# Patient Record
Sex: Female | Born: 1985 | Race: Black or African American | Hispanic: No | Marital: Single | Smoking: Former smoker
Health system: Southern US, Community
[De-identification: ages and names within clinical notes are randomized; demographics above are authoritative.]

## PROBLEM LIST (undated history)

## (undated) DIAGNOSIS — Z6741 Type O blood, Rh negative: Secondary | ICD-10-CM

## (undated) DIAGNOSIS — O139 Gestational [pregnancy-induced] hypertension without significant proteinuria, unspecified trimester: Secondary | ICD-10-CM

## (undated) DIAGNOSIS — Z8489 Family history of other specified conditions: Secondary | ICD-10-CM

## (undated) DIAGNOSIS — D690 Allergic purpura: Secondary | ICD-10-CM

## (undated) DIAGNOSIS — N390 Urinary tract infection, site not specified: Secondary | ICD-10-CM

## (undated) DIAGNOSIS — I10 Essential (primary) hypertension: Secondary | ICD-10-CM

## (undated) HISTORY — PX: WISDOM TOOTH EXTRACTION: SHX21

## (undated) HISTORY — DX: Essential (primary) hypertension: I10

## (undated) HISTORY — DX: Type O blood, Rh negative: Z67.41

## (undated) HISTORY — DX: Gestational (pregnancy-induced) hypertension without significant proteinuria, unspecified trimester: O13.9

## (undated) HISTORY — PX: OTHER SURGICAL HISTORY: SHX169

---

## 2004-06-18 ENCOUNTER — Emergency Department (HOSPITAL_COMMUNITY): Admission: EM | Admit: 2004-06-18 | Discharge: 2004-06-18 | Payer: Self-pay | Admitting: Emergency Medicine

## 2004-09-24 ENCOUNTER — Emergency Department (HOSPITAL_COMMUNITY): Admission: EM | Admit: 2004-09-24 | Discharge: 2004-09-24 | Payer: Self-pay | Admitting: Emergency Medicine

## 2005-05-05 ENCOUNTER — Emergency Department (HOSPITAL_COMMUNITY): Admission: EM | Admit: 2005-05-05 | Discharge: 2005-05-06 | Payer: Self-pay | Admitting: Emergency Medicine

## 2005-05-26 ENCOUNTER — Emergency Department (HOSPITAL_COMMUNITY): Admission: EM | Admit: 2005-05-26 | Discharge: 2005-05-26 | Payer: Self-pay | Admitting: Family Medicine

## 2005-07-19 ENCOUNTER — Inpatient Hospital Stay (HOSPITAL_COMMUNITY): Admission: AD | Admit: 2005-07-19 | Discharge: 2005-07-20 | Payer: Self-pay | Admitting: *Deleted

## 2005-11-25 ENCOUNTER — Inpatient Hospital Stay (HOSPITAL_COMMUNITY): Admission: AD | Admit: 2005-11-25 | Discharge: 2005-11-25 | Payer: Self-pay | Admitting: Gynecology

## 2005-12-07 ENCOUNTER — Emergency Department (HOSPITAL_COMMUNITY): Admission: EM | Admit: 2005-12-07 | Discharge: 2005-12-07 | Payer: Self-pay | Admitting: Emergency Medicine

## 2006-04-23 ENCOUNTER — Other Ambulatory Visit: Admission: RE | Admit: 2006-04-23 | Discharge: 2006-04-23 | Payer: Self-pay | Admitting: Gynecology

## 2007-09-02 ENCOUNTER — Other Ambulatory Visit: Admission: RE | Admit: 2007-09-02 | Discharge: 2007-09-02 | Payer: Self-pay | Admitting: Gynecology

## 2007-12-08 ENCOUNTER — Emergency Department (HOSPITAL_COMMUNITY): Admission: EM | Admit: 2007-12-08 | Discharge: 2007-12-08 | Payer: Self-pay | Admitting: Emergency Medicine

## 2008-05-03 ENCOUNTER — Encounter: Payer: Self-pay | Admitting: Gynecology

## 2008-05-03 ENCOUNTER — Ambulatory Visit: Payer: Self-pay | Admitting: Gynecology

## 2008-05-03 ENCOUNTER — Other Ambulatory Visit: Admission: RE | Admit: 2008-05-03 | Discharge: 2008-05-03 | Payer: Self-pay | Admitting: Gynecology

## 2008-08-29 DIAGNOSIS — I10 Essential (primary) hypertension: Secondary | ICD-10-CM

## 2008-08-29 HISTORY — DX: Essential (primary) hypertension: I10

## 2008-09-05 ENCOUNTER — Encounter: Payer: Self-pay | Admitting: Women's Health

## 2008-09-05 ENCOUNTER — Other Ambulatory Visit: Admission: RE | Admit: 2008-09-05 | Discharge: 2008-09-05 | Payer: Self-pay | Admitting: Gynecology

## 2008-09-05 ENCOUNTER — Ambulatory Visit: Payer: Self-pay | Admitting: Women's Health

## 2009-01-23 ENCOUNTER — Ambulatory Visit: Payer: Self-pay | Admitting: Women's Health

## 2009-02-23 ENCOUNTER — Ambulatory Visit: Payer: Self-pay | Admitting: Women's Health

## 2009-03-27 ENCOUNTER — Ambulatory Visit: Payer: Self-pay | Admitting: Women's Health

## 2009-08-01 ENCOUNTER — Ambulatory Visit: Payer: Self-pay | Admitting: Women's Health

## 2009-08-17 ENCOUNTER — Ambulatory Visit: Payer: Self-pay | Admitting: Women's Health

## 2009-08-17 ENCOUNTER — Other Ambulatory Visit: Admission: RE | Admit: 2009-08-17 | Discharge: 2009-08-17 | Payer: Self-pay | Admitting: Gynecology

## 2009-11-21 ENCOUNTER — Ambulatory Visit: Payer: Self-pay | Admitting: Gynecology

## 2009-11-30 ENCOUNTER — Inpatient Hospital Stay (HOSPITAL_COMMUNITY): Admission: AD | Admit: 2009-11-30 | Discharge: 2009-11-30 | Payer: Self-pay | Admitting: Gynecology

## 2009-11-30 ENCOUNTER — Ambulatory Visit: Payer: Self-pay | Admitting: Physician Assistant

## 2009-12-18 ENCOUNTER — Ambulatory Visit: Payer: Self-pay | Admitting: Women's Health

## 2010-08-24 ENCOUNTER — Other Ambulatory Visit (HOSPITAL_COMMUNITY)
Admission: RE | Admit: 2010-08-24 | Discharge: 2010-08-24 | Disposition: A | Discharge status: Home / Self Care | Payer: Self-pay | Source: Ambulatory Visit | Attending: Gynecology | Admitting: Gynecology

## 2010-08-24 ENCOUNTER — Other Ambulatory Visit: Payer: Self-pay | Admitting: Women's Health

## 2010-08-24 ENCOUNTER — Ambulatory Visit
Admission: RE | Admit: 2010-08-24 | Discharge: 2010-08-24 | Payer: Self-pay | Source: Home / Self Care | Attending: Women's Health | Admitting: Women's Health

## 2010-08-24 DIAGNOSIS — Z124 Encounter for screening for malignant neoplasm of cervix: Secondary | ICD-10-CM | POA: Insufficient documentation

## 2010-10-16 LAB — ABO/RH
ABO/RH(D): O NEG
Weak D: NEGATIVE

## 2010-10-16 LAB — URINALYSIS, ROUTINE W REFLEX MICROSCOPIC
Bilirubin Urine: NEGATIVE
Glucose, UA: NEGATIVE mg/dL
Ketones, ur: NEGATIVE mg/dL
Nitrite: NEGATIVE
Protein, ur: NEGATIVE mg/dL
Specific Gravity, Urine: 1.015 (ref 1.005–1.030)
Urobilinogen, UA: 0.2 mg/dL (ref 0.0–1.0)
pH: 6.5 (ref 5.0–8.0)

## 2010-10-16 LAB — HCG, QUANTITATIVE, PREGNANCY: hCG, Beta Chain, Quant, S: 454 m[IU]/mL — ABNORMAL HIGH (ref <5)

## 2010-10-16 LAB — CBC
HCT: 39.9 % (ref 36.0–46.0)
Hemoglobin: 13.5 g/dL (ref 12.0–15.0)
MCHC: 33.9 g/dL (ref 30.0–36.0)
MCV: 90.3 fL (ref 78.0–100.0)
Platelets: 296 10*3/uL (ref 150–400)
RBC: 4.41 MIL/uL (ref 3.87–5.11)
RDW: 14.2 % (ref 11.5–15.5)
WBC: 9.7 10*3/uL (ref 4.0–10.5)

## 2010-10-16 LAB — RH IMMUNE GLOBULIN WORKUP (NOT WOMEN'S HOSP)
ABO/RH(D): O NEG
Antibody Screen: NEGATIVE

## 2010-10-16 LAB — URINE MICROSCOPIC-ADD ON

## 2010-10-16 LAB — POCT PREGNANCY, URINE: Preg Test, Ur: POSITIVE

## 2011-01-02 ENCOUNTER — Ambulatory Visit (INDEPENDENT_AMBULATORY_CARE_PROVIDER_SITE_OTHER): Payer: Self-pay | Admitting: Women's Health

## 2011-01-02 DIAGNOSIS — N898 Other specified noninflammatory disorders of vagina: Secondary | ICD-10-CM

## 2011-01-02 DIAGNOSIS — N760 Acute vaginitis: Secondary | ICD-10-CM

## 2011-01-02 DIAGNOSIS — R350 Frequency of micturition: Secondary | ICD-10-CM

## 2011-02-06 ENCOUNTER — Ambulatory Visit: Payer: Self-pay | Admitting: Women's Health

## 2011-02-07 ENCOUNTER — Ambulatory Visit (INDEPENDENT_AMBULATORY_CARE_PROVIDER_SITE_OTHER): Payer: Self-pay | Admitting: Women's Health

## 2011-02-07 DIAGNOSIS — N912 Amenorrhea, unspecified: Secondary | ICD-10-CM

## 2011-02-12 ENCOUNTER — Inpatient Hospital Stay (HOSPITAL_COMMUNITY): Payer: Medicaid Other

## 2011-02-12 ENCOUNTER — Encounter (HOSPITAL_COMMUNITY): Payer: Self-pay | Admitting: *Deleted

## 2011-02-12 ENCOUNTER — Inpatient Hospital Stay (HOSPITAL_COMMUNITY)
Admission: AD | Admit: 2011-02-12 | Discharge: 2011-02-13 | Disposition: A | Discharge status: Home / Self Care | Payer: Medicaid Other | Source: Ambulatory Visit | Attending: Obstetrics & Gynecology | Admitting: Obstetrics & Gynecology

## 2011-02-12 DIAGNOSIS — B9689 Other specified bacterial agents as the cause of diseases classified elsewhere: Secondary | ICD-10-CM

## 2011-02-12 DIAGNOSIS — A499 Bacterial infection, unspecified: Secondary | ICD-10-CM

## 2011-02-12 DIAGNOSIS — D219 Benign neoplasm of connective and other soft tissue, unspecified: Secondary | ICD-10-CM

## 2011-02-12 DIAGNOSIS — O365990 Maternal care for other known or suspected poor fetal growth, unspecified trimester, not applicable or unspecified: Secondary | ICD-10-CM | POA: Insufficient documentation

## 2011-02-12 DIAGNOSIS — O26859 Spotting complicating pregnancy, unspecified trimester: Secondary | ICD-10-CM | POA: Insufficient documentation

## 2011-02-12 DIAGNOSIS — D259 Leiomyoma of uterus, unspecified: Secondary | ICD-10-CM

## 2011-02-12 DIAGNOSIS — N760 Acute vaginitis: Secondary | ICD-10-CM

## 2011-02-12 DIAGNOSIS — O200 Threatened abortion: Secondary | ICD-10-CM

## 2011-02-12 HISTORY — DX: Urinary tract infection, site not specified: N39.0

## 2011-02-12 LAB — URINALYSIS, ROUTINE W REFLEX MICROSCOPIC
Bilirubin Urine: NEGATIVE
Glucose, UA: NEGATIVE mg/dL
Ketones, ur: NEGATIVE mg/dL
Leukocytes, UA: NEGATIVE
Nitrite: NEGATIVE
Protein, ur: NEGATIVE mg/dL
Specific Gravity, Urine: 1.025 (ref 1.005–1.030)
Urobilinogen, UA: 0.2 mg/dL (ref 0.0–1.0)
pH: 6 (ref 5.0–8.0)

## 2011-02-12 LAB — DIFFERENTIAL
Basophils Absolute: 0 10*3/uL (ref 0.0–0.1)
Basophils Relative: 0 % (ref 0–1)
Eosinophils Absolute: 0.2 10*3/uL (ref 0.0–0.7)
Eosinophils Relative: 2 % (ref 0–5)
Lymphocytes Relative: 38 % (ref 12–46)
Lymphs Abs: 4.1 10*3/uL — ABNORMAL HIGH (ref 0.7–4.0)
Monocytes Absolute: 0.9 10*3/uL (ref 0.1–1.0)
Monocytes Relative: 9 % (ref 3–12)
Neutro Abs: 5.5 10*3/uL (ref 1.7–7.7)
Neutrophils Relative %: 51 % (ref 43–77)

## 2011-02-12 LAB — URINE MICROSCOPIC-ADD ON

## 2011-02-12 LAB — CBC
HCT: 38.3 % (ref 36.0–46.0)
Hemoglobin: 12.9 g/dL (ref 12.0–15.0)
MCH: 28.9 pg (ref 26.0–34.0)
MCHC: 33.7 g/dL (ref 30.0–36.0)
MCV: 85.7 fL (ref 78.0–100.0)
Platelets: 284 10*3/uL (ref 150–400)
RBC: 4.47 MIL/uL (ref 3.87–5.11)
RDW: 14.3 % (ref 11.5–15.5)
WBC: 10.8 10*3/uL — ABNORMAL HIGH (ref 4.0–10.5)

## 2011-02-12 LAB — POCT PREGNANCY, URINE: Preg Test, Ur: POSITIVE

## 2011-02-12 LAB — ABO/RH: ABO/RH(D): O NEG

## 2011-02-12 LAB — WET PREP, GENITAL
Trich, Wet Prep: NONE SEEN
Yeast Wet Prep HPF POC: NONE SEEN

## 2011-02-12 LAB — HCG, QUANTITATIVE, PREGNANCY: hCG, Beta Chain, Quant, S: 3311 m[IU]/mL — ABNORMAL HIGH (ref <5)

## 2011-02-12 MED ORDER — METRONIDAZOLE 500 MG PO TABS: 500.0000 mg | ORAL_TABLET | Freq: Two times a day (BID) | ORAL | Status: AC

## 2011-02-12 MED ORDER — RHO D IMMUNE GLOBULIN 1500 UNIT/2ML IJ SOLN: 300.0000 ug | Freq: Once | INTRAMUSCULAR | Status: DC

## 2011-02-12 NOTE — Plan of Care (Signed)
Pt and significant other wanting to leave and come back when Rhophylac is ready.

## 2011-02-12 NOTE — ED Provider Notes (Addendum)
History     Chief Complaint  Patient presents with  . Vaginal Bleeding   The history is provided by the patient.   the patient states that for the past few days she has had spotting. Today began having low abdominal cramping, 6.1 wks. Gest by LMP  Past Medical History  Diagnosis Date  . Hypertension 08/2008    NO MEDS  . Type o blood, rh negative     O-    No past surgical history on file.  Family History  Problem Relation Age of Onset  . Diabetes Father   . Hypertension Father   . Cancer Maternal Grandmother     THROAT CA  . Hypertension Paternal Grandmother     History  Substance Use Topics  . Smoking status: Current Everyday Smoker -- 3 years    Types: Cigarettes  . Smokeless tobacco: Not on file  . Alcohol Use: Yes    OB History    Grav Para Term Preterm Abortions TAB SAB Ect Mult Living   4 0 0 0  3 0 0 0 0      Review of Systems  Gastrointestinal: Positive for nausea and abdominal pain.  Genitourinary: Positive for frequency.  Musculoskeletal: Positive for back pain.  All other systems reviewed and are negative.    Physical Exam  BP 111/57  Pulse 88  Temp(Src) 98.7 F (37.1 C) (Oral)  Resp 16  Ht 5\' 2"  (1.575 m)  Wt 148 lb (67.132 kg)  BMI 27.07 kg/m2  SpO2 99%  LMP 12/31/2010  Physical Exam  Nursing note and vitals reviewed. Constitutional: She is oriented to person, place, and time. She appears well-developed and well-nourished.  Eyes: EOM are normal.  Neck: Neck supple.  Pulmonary/Chest: Effort normal.  Abdominal: Soft. There is tenderness in the right lower quadrant. There is no rebound and no guarding.  Genitourinary: Uterus is not enlarged. Cervix exhibits motion tenderness. Right adnexum displays tenderness.       Small amount of blood vaginal vault.  Musculoskeletal: Normal range of motion.  Neurological: She is alert and oriented to person, place, and time. No cranial nerve deficit.  Skin: Skin is warm and dry.    ED Course    Procedures  MDM Ultrasound shows a 5.1 wk. IUGS with YS and small SCH and small fibroid.  Will d/c pt. Home with inst. On threatened AB.     [employer], [place] 02/28/11 [phone]

## 2011-02-12 NOTE — Progress Notes (Signed)
Pt c/o  Pink spotting once Saturday and once today.  She also says she has had a mild ache yesterday and today in her low abd.

## 2011-02-12 NOTE — Progress Notes (Signed)
Pt G3 P0 at 6.1wks, reports small amt of blood on tissue x 2 episodes.  Pt having lower abd aching that started today,  Hx of Yeast inf, treated with monistat 2 wks ago.

## 2011-02-13 ENCOUNTER — Inpatient Hospital Stay (HOSPITAL_COMMUNITY)
Admission: AD | Admit: 2011-02-13 | Discharge: 2011-02-13 | Disposition: A | Discharge status: Home / Self Care | Payer: Medicaid Other | Source: Ambulatory Visit | Attending: Obstetrics and Gynecology | Admitting: Obstetrics and Gynecology

## 2011-02-13 ENCOUNTER — Other Ambulatory Visit (HOSPITAL_COMMUNITY): Payer: Self-pay | Admitting: *Deleted

## 2011-02-13 DIAGNOSIS — O209 Hemorrhage in early pregnancy, unspecified: Secondary | ICD-10-CM | POA: Insufficient documentation

## 2011-02-13 LAB — GC/CHLAMYDIA PROBE AMP, GENITAL
Chlamydia, DNA Probe: NEGATIVE
GC Probe Amp, Genital: NEGATIVE

## 2011-02-13 MED ORDER — RHO D IMMUNE GLOBULIN 1500 UNIT/2ML IJ SOLN
300.0000 ug | Freq: Once | INTRAMUSCULAR | Status: AC
  Administered 2011-02-13: 300 ug via INTRAMUSCULAR

## 2011-02-13 MED ORDER — RHO D IMMUNE GLOBULIN 1500 UNIT/2ML IJ SOLN: 300.0000 ug | Freq: Once | INTRAMUSCULAR | Status: DC

## 2011-02-13 NOTE — ED Notes (Signed)
The rhophlac order did not cross over in the computer to the Blood Bank/lab-due to this-they would not release the med -T.Stallings,HC was called and the [employer] person was called-lab agreed to take a written order-this all took about 1 hour to problem solve-the pt just left and stated she will come back tomorrow-note she was here 6 hours for this visit-she states she has to work tomorrow and she cannot stay any longer

## 2011-02-14 LAB — RH IG WORKUP (INCLUDES ABO/RH)
ABO/RH(D): O NEG
Antibody Screen: NEGATIVE
Gestational Age(Wks): 6
Unit division: 0

## 2011-02-18 ENCOUNTER — Inpatient Hospital Stay (HOSPITAL_COMMUNITY): Payer: Medicaid Other

## 2011-02-18 ENCOUNTER — Inpatient Hospital Stay (HOSPITAL_COMMUNITY)
Admission: AD | Admit: 2011-02-18 | Discharge: 2011-02-18 | Disposition: A | Discharge status: Home / Self Care | Payer: Medicaid Other | Source: Ambulatory Visit | Attending: Obstetrics & Gynecology | Admitting: Obstetrics & Gynecology

## 2011-02-18 ENCOUNTER — Encounter (HOSPITAL_COMMUNITY): Payer: Self-pay | Admitting: *Deleted

## 2011-02-18 DIAGNOSIS — Z3400 Encounter for supervision of normal first pregnancy, unspecified trimester: Secondary | ICD-10-CM

## 2011-02-18 DIAGNOSIS — Z3480 Encounter for supervision of other normal pregnancy, unspecified trimester: Secondary | ICD-10-CM | POA: Insufficient documentation

## 2011-02-18 DIAGNOSIS — O4690 Antepartum hemorrhage, unspecified, unspecified trimester: Secondary | ICD-10-CM | POA: Insufficient documentation

## 2011-02-18 LAB — URINE MICROSCOPIC-ADD ON

## 2011-02-18 LAB — URINALYSIS, ROUTINE W REFLEX MICROSCOPIC
Bilirubin Urine: NEGATIVE
Glucose, UA: NEGATIVE mg/dL
Ketones, ur: NEGATIVE mg/dL
Leukocytes, UA: NEGATIVE
Nitrite: NEGATIVE
Protein, ur: NEGATIVE mg/dL
Specific Gravity, Urine: 1.02 (ref 1.005–1.030)
Urobilinogen, UA: 0.2 mg/dL (ref 0.0–1.0)
pH: 6.5 (ref 5.0–8.0)

## 2011-02-18 NOTE — Progress Notes (Signed)
Pt states lmp-01/08/2011, awoke early this am, has intercourse, noted small clot when she went to restroom, no bleeding at present. Denies pain.

## 2011-02-18 NOTE — Progress Notes (Signed)
Pt in c/o spotting after intercourse- states she passed a very small clot.  Has had no cramping.  Denies any pain or bleeding present.  Was seen here on 7/17- treated for BV.  Has 2 more pills to take.  Received rhogam on 7/18.

## 2011-02-18 NOTE — ED Provider Notes (Signed)
Suzanne Bullock is a 25 y.o. female presenting for confirmation of viable IUP. She was seen in MAU 6 days ago for spotting and received Rhophylac. She denies any further spotting or abd pain. History OB History    Grav Para Term Preterm Abortions TAB SAB Ect Mult Living   4    3 1 2    0     Past Medical History  Diagnosis Date  . Hypertension 08/2008    NO MEDS  . Type o blood, rh negative     O-  . Urinary tract infection    Past Surgical History  Procedure Date  . Abortions   . No past surgeries    Family History: family history includes Cancer in her maternal grandmother; Diabetes in her father; and Hypertension in her father and paternal grandmother. Social History:  reports that she has quit smoking. Her smoking use included Cigarettes. She has a .75 pack-year smoking history. She does not have any smokeless tobacco history on file. She reports that she does not drink alcohol or use illicit drugs.  Review of Systems  All other systems reviewed and are negative.     Blood pressure 128/55, pulse 94, temperature 98 F (36.7 C), temperature source Oral, resp. rate 18, height 5\' 2"  (1.575 m), weight 66.395 kg (146 lb 6 oz), last menstrual period 12/31/2010. Maternal Exam:  Abdomen: Patient reports no abdominal tenderness.   Physical Exam  Vitals reviewed. Constitutional: She is oriented to person, place, and time. She appears well-developed and well-nourished. No distress.  Cardiovascular: Normal rate.   Respiratory: Effort normal.  GI: Soft.  Neurological: She is alert and oriented to person, place, and time.  Skin: Skin is warm and dry.  Psychiatric: She has a normal mood and affect.    Prenatal labs: ABO, Rh: O NEG (07/18 0914) Antibody: NEG (07/18 0914) Rubella:   RPR:    HBsAg:    HIV:    GBS:     [place]: 5.6 week SIUP, FHR 66, +YS and FP  Assessment/Plan: 1. 5.6 week viable, SIUP  Plan:  1. D/C home 2. SAB precautions 3. Initiate PNC. Plans to go to [facility]  Avid Guillette 02/18/2011, 5:43 PM

## 2011-02-22 ENCOUNTER — Ambulatory Visit (INDEPENDENT_AMBULATORY_CARE_PROVIDER_SITE_OTHER): Payer: Medicaid Other | Admitting: Women's Health

## 2011-02-22 ENCOUNTER — Encounter: Payer: Self-pay | Admitting: Women's Health

## 2011-02-22 VITALS — BP 120/70

## 2011-02-22 DIAGNOSIS — B373 Candidiasis of vulva and vagina: Secondary | ICD-10-CM

## 2011-02-22 DIAGNOSIS — N898 Other specified noninflammatory disorders of vagina: Secondary | ICD-10-CM

## 2011-02-22 DIAGNOSIS — N899 Noninflammatory disorder of vagina, unspecified: Secondary | ICD-10-CM

## 2011-02-22 MED ORDER — TERCONAZOLE 0.8 % VA CREA: 1.0000 | TOPICAL_CREAM | Freq: Every day | VAGINAL | Status: AC

## 2011-02-22 NOTE — Progress Notes (Signed)
  25 yo [redacted]wk pregnant who was treated with flagyl last week for BV. She was treated at [facility]. She has not started her prenatal care yet. She is taking a prenatal vitamin daily. She had tried some over-the-counter Monistat without good relief. Denies any bleeding or pain. She has had negative cultures with her current partner. She is without insurance and requests to have prenatal care done later, she is in the process of being Medicaid approved. Offered and declined viability ultrasound today.  External genitalia is erythematous. Speculum exam copious white curdy discharge was noted. Wet prep is positive for yeast.  Bimanual uterus was 6-8 weeks' size nontender no adnexal fullness or tenderness. We'll treat with Terazol 3 one applicator HS x3 prescriptions was given. Yeast prevention discussed, return to office if no relief and schedule prenatal care ASAP.Marland Kitchen

## 2011-03-17 ENCOUNTER — Encounter (HOSPITAL_COMMUNITY): Payer: Self-pay | Admitting: Advanced Practice Midwife

## 2011-03-17 ENCOUNTER — Inpatient Hospital Stay (HOSPITAL_COMMUNITY): Payer: Medicaid Other

## 2011-03-17 ENCOUNTER — Inpatient Hospital Stay (HOSPITAL_COMMUNITY)
Admission: AD | Admit: 2011-03-17 | Discharge: 2011-03-17 | Disposition: A | Discharge status: Home / Self Care | Payer: Medicaid Other | Source: Ambulatory Visit | Attending: Obstetrics & Gynecology | Admitting: Obstetrics & Gynecology

## 2011-03-17 DIAGNOSIS — O034 Incomplete spontaneous abortion without complication: Secondary | ICD-10-CM | POA: Insufficient documentation

## 2011-03-17 LAB — CBC
HCT: 41 % (ref 36.0–46.0)
Hemoglobin: 13.9 g/dL (ref 12.0–15.0)
MCH: 29 pg (ref 26.0–34.0)
MCHC: 33.9 g/dL (ref 30.0–36.0)
MCV: 85.4 fL (ref 78.0–100.0)
Platelets: 299 10*3/uL (ref 150–400)
RBC: 4.8 MIL/uL (ref 3.87–5.11)
RDW: 14.4 % (ref 11.5–15.5)
WBC: 7.2 10*3/uL (ref 4.0–10.5)

## 2011-03-17 LAB — HCG, QUANTITATIVE, PREGNANCY: hCG, Beta Chain, Quant, S: 4249 m[IU]/mL — ABNORMAL HIGH (ref <5)

## 2011-03-17 NOTE — ED Provider Notes (Signed)
History     Chief Complaint  Patient presents with  . Vaginal Bleeding   HPI Intercourse yesterday, bleeding afterwards, no cramping, bleeding stopped last night. Small clot this morning, started bleeding again this afternoon. No pain. Has had recurrent episodes of bleeding since onset of pregnancy. Blood type is O neg, had rhogam in MAU in July. U/S in MAU on 7/23 showed 5.6 wk IUP, FHR 66.   OB History    Grav Para Term Preterm Abortions TAB SAB Ect Mult Living   4    3 2 1    0      Past Medical History  Diagnosis Date  . Hypertension 08/2008    NO MEDS  . Type o blood, rh negative     O-  . Urinary tract infection     Past Surgical History  Procedure Date  . Abortions   . No past surgeries     Family History  Problem Relation Age of Onset  . Diabetes Father   . Hypertension Father   . Cancer Maternal Grandmother     THROAT CA  . Hypertension Paternal Grandmother     History  Substance Use Topics  . Smoking status: Former Smoker -- 0.2 packs/day for 3 years    Types: Cigarettes  . Smokeless tobacco: Not on file  . Alcohol Use: No    Allergies: No Known Allergies  Prescriptions prior to admission  Medication Sig Dispense Refill  . acetaminophen (TYLENOL) 500 MG tablet Take 1,000 mg by mouth daily as needed. For pain.       . prenatal vitamin w/FE, FA (PRENATAL 1 + 1) 27-1 MG TABS Take 1 tablet by mouth daily.          Review of Systems  Constitutional: Negative.   Respiratory: Negative.   Cardiovascular: Negative.   Gastrointestinal: Negative for nausea, vomiting, abdominal pain, diarrhea and constipation.  Genitourinary: Negative for dysuria, urgency, frequency, hematuria and flank pain.       Negative for pain, Positive for vaginal bleeding  Musculoskeletal: Negative.   Neurological: Negative.   Psychiatric/Behavioral: Negative.    Physical Exam   Blood pressure 130/86, pulse 85, temperature 97.9 F (36.6 C), temperature source Oral, resp. rate  18, height 5\' 3"  (1.6 m), weight 67.042 kg (147 lb 12.8 oz), last menstrual period 01/08/2011.  Physical Exam  Nursing note and vitals reviewed. Constitutional: She is oriented to person, place, and time. She appears well-developed and well-nourished. No distress.  HENT:  Head: Normocephalic and atraumatic.  Cardiovascular: Normal rate.   Respiratory: Effort normal.  GI: Soft. Bowel sounds are normal. She exhibits no mass. There is no tenderness. There is no rebound and no guarding.  Genitourinary: There is no rash or lesion on the right labia. There is no rash or lesion on the left labia. There is bleeding (small to moderate amount) around the vagina. No tenderness around the vagina. No vaginal discharge found.  Musculoskeletal: Normal range of motion.  Neurological: She is alert and oriented to person, place, and time.  Skin: Skin is warm and dry.  Psychiatric: She has a normal mood and affect.    MAU Course  Procedures  U/S shows 5.6 wk IUGS, YS and fetal pole, no FHR. Prior u/s on 7/23 showed 5.6 wk fetal pole with FHR of 66. Incomplete AB.   Assessment and Plan  25 y.o. G4P0030 with incomplete AB Counseled patient regarding expectant mgmt vs. cytotec - pt desires expectant management at this time.  F/U 1 week in MAU for repeat quant Return sooner with excessive bleeding or severe pain  Ociel Retherford 03/17/2011, 1:46 PM

## 2011-03-17 NOTE — Progress Notes (Signed)
Pt states that after intercourse yesterday she had some vaginal bleeding. Pt reports that she had a golf ball sized clot yesterday, then a smaller clot later last night. This morning she reports brown vaginal discharge and  On small clot.

## 2011-03-17 NOTE — ED Provider Notes (Signed)
Attestation of Attending Supervision of Advanced Practitioner: Evaluation and management procedures were performed by the PA/NP/CNM/OB Fellow under my supervision/collaboration. Chart reviewed and agree with management and plan.  Icis Budreau A 03/17/2011 5:56 PM   

## 2011-03-17 NOTE — Initial Assessments (Signed)
Pt refused comfort care packet and pillow. Pt states that she does not want a chaplain to call.

## 2011-03-18 ENCOUNTER — Inpatient Hospital Stay (HOSPITAL_COMMUNITY)
Admission: AD | Admit: 2011-03-18 | Discharge: 2011-03-19 | Disposition: A | Discharge status: Home / Self Care | Payer: Medicaid Other | Source: Ambulatory Visit | Attending: Obstetrics & Gynecology | Admitting: Obstetrics & Gynecology

## 2011-03-18 ENCOUNTER — Encounter (HOSPITAL_COMMUNITY): Payer: Self-pay | Admitting: *Deleted

## 2011-03-18 DIAGNOSIS — O034 Incomplete spontaneous abortion without complication: Secondary | ICD-10-CM | POA: Insufficient documentation

## 2011-03-18 DIAGNOSIS — O039 Complete or unspecified spontaneous abortion without complication: Secondary | ICD-10-CM

## 2011-03-18 MED ORDER — KETOROLAC TROMETHAMINE 60 MG/2ML IM SOLN
60.0000 mg | Freq: Once | INTRAMUSCULAR | Status: AC
  Administered 2011-03-18: 60 mg via INTRAMUSCULAR
  Filled 2011-03-18: qty 2

## 2011-03-18 NOTE — Progress Notes (Signed)
PT ARRIVED  SAYING GOING TO PASS OUT- BREATHING FAST. SAYS SHE WAS HERE LAST NIGHT FOR SAB. AND BLEEDING BECAME WORSE- PASSING CLOTS AT 7PM . HAS CRAMPING.  IN RM 6- WASH CLOTH - HAS SMALL AMT RED BLOOD .

## 2011-03-19 LAB — CBC
HCT: 36.8 % (ref 36.0–46.0)
Hemoglobin: 12.4 g/dL (ref 12.0–15.0)
MCH: 29.4 pg (ref 26.0–34.0)
MCHC: 33.7 g/dL (ref 30.0–36.0)
MCV: 87.2 fL (ref 78.0–100.0)
Platelets: 270 10*3/uL (ref 150–400)
RBC: 4.22 MIL/uL (ref 3.87–5.11)
RDW: 14.3 % (ref 11.5–15.5)
WBC: 12.7 10*3/uL — ABNORMAL HIGH (ref 4.0–10.5)

## 2011-03-19 MED ORDER — IBUPROFEN 600 MG PO TABS: 600.0000 mg | ORAL_TABLET | Freq: Four times a day (QID) | ORAL | Status: AC | PRN

## 2011-03-19 NOTE — Progress Notes (Signed)
Pain reported as improved once tissue removed from cervical os.

## 2011-03-19 NOTE — ED Provider Notes (Signed)
History     Chief Complaint  Patient presents with  . Vaginal Bleeding   HPI  Pt here for increased vaginal bleeding.  Seen in MAU on 03/17/11 and diagnosed with incomplete miscarriage by ultrasound (fetal heart rate not seen after previously being visible).  Blood type RH neg, received Rhogam 4 weeks ago. DC'd home with expectant management yesterday.  Reports increased bleeding that began around 1400 with cramping.  Feels like "going to pass out" due to the pain.  +nausea.  Past Medical History  Diagnosis Date  . Hypertension 08/2008    NO MEDS  . Type o blood, rh negative     O-  . Urinary tract infection     Past Surgical History  Procedure Date  . Abortions   . No past surgeries     Family History  Problem Relation Age of Onset  . Diabetes Father   . Hypertension Father   . Cancer Maternal Grandmother     THROAT CA  . Hypertension Paternal Grandmother     History  Substance Use Topics  . Smoking status: Current Everyday Smoker -- 0.2 packs/day for 3 years    Types: Cigarettes  . Smokeless tobacco: Not on file  . Alcohol Use: No    Allergies: No Known Allergies  Prescriptions prior to admission  Medication Sig Dispense Refill  . acetaminophen (TYLENOL) 500 MG tablet Take 1,000 mg by mouth daily as needed. For pain.       . prenatal vitamin w/FE, FA (PRENATAL 1 + 1) 27-1 MG TABS Take 1 tablet by mouth daily.          Review of Systems  Gastrointestinal: Positive for nausea and abdominal pain. Negative for vomiting.  Genitourinary:       Vaginal bleeding  All other systems reviewed and are negative.   Physical Exam   Blood pressure 120/72, pulse 90, temperature 97.9 F (36.6 C), temperature source Oral, resp. rate 22, height 5\' 3"  (1.6 m), weight 66.679 kg (147 lb), last menstrual period 01/08/2011.  Physical Exam  Constitutional: She is oriented to person, place, and time. She appears well-developed and well-nourished. She appears distressed (appears  uncomfortable).       Uncomfortable appearing  HENT:  Head: Normocephalic.  Neck: Normal range of motion. Neck supple.  Cardiovascular: Normal rate, regular rhythm and normal heart sounds.  Exam reveals no gallop and no friction rub.   No murmur heard. Respiratory: Effort normal and breath sounds normal. No respiratory distress.  GI: Soft. She exhibits no mass. There is tenderness (suprapubic). There is no rebound and no guarding.  Genitourinary: There is bleeding (moderate; +clots; fetal tissue teased out os) around the vagina.  Neurological: She is alert and oriented to person, place, and time.  Skin: Skin is warm and dry.    MAU Course  Procedures  CBC - wnl Toradol  Assessment and Plan  Miscarriage  RX Ibuprofen Explained bleeding with miscarriage and when to return.  [facility] 03/19/2011, 12:08 AM

## 2011-03-19 NOTE — ED Provider Notes (Signed)
Agree with above note.  Georgie Haque H. 03/19/2011 6:27 AM

## 2011-03-19 NOTE — Progress Notes (Signed)
Bleeding scant at time of discharge.

## 2011-03-19 NOTE — Progress Notes (Signed)
Pt reports pain 0/10 and desires discharge home.

## 2011-03-24 ENCOUNTER — Ambulatory Visit (HOSPITAL_COMMUNITY): Payer: Medicaid Other

## 2011-03-30 DEATH — deceased

## 2011-07-26 ENCOUNTER — Emergency Department (HOSPITAL_COMMUNITY): Payer: No Typology Code available for payment source

## 2011-07-26 ENCOUNTER — Emergency Department (HOSPITAL_COMMUNITY)
Admission: EM | Admit: 2011-07-26 | Discharge: 2011-07-26 | Disposition: A | Discharge status: Home / Self Care | Payer: No Typology Code available for payment source | Attending: Emergency Medicine | Admitting: Emergency Medicine

## 2011-07-26 ENCOUNTER — Encounter (HOSPITAL_COMMUNITY): Payer: Self-pay | Admitting: Emergency Medicine

## 2011-07-26 DIAGNOSIS — S139XXA Sprain of joints and ligaments of unspecified parts of neck, initial encounter: Secondary | ICD-10-CM | POA: Insufficient documentation

## 2011-07-26 DIAGNOSIS — M549 Dorsalgia, unspecified: Secondary | ICD-10-CM | POA: Insufficient documentation

## 2011-07-26 DIAGNOSIS — S161XXA Strain of muscle, fascia and tendon at neck level, initial encounter: Secondary | ICD-10-CM

## 2011-07-26 LAB — POCT PREGNANCY, URINE: Preg Test, Ur: NEGATIVE

## 2011-07-26 MED ORDER — HYDROCODONE-ACETAMINOPHEN 5-500 MG PO TABS: 1.0000 | ORAL_TABLET | Freq: Four times a day (QID) | ORAL | Status: AC | PRN

## 2011-07-26 MED ORDER — IBUPROFEN 600 MG PO TABS: 600.0000 mg | ORAL_TABLET | Freq: Four times a day (QID) | ORAL | Status: AC | PRN

## 2011-07-26 MED ORDER — OXYCODONE-ACETAMINOPHEN 5-325 MG PO TABS
2.0000 | ORAL_TABLET | Freq: Once | ORAL | Status: AC
  Administered 2011-07-26: 2 via ORAL
  Filled 2011-07-26: qty 2
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
## 2012-01-17 ENCOUNTER — Inpatient Hospital Stay (HOSPITAL_COMMUNITY)
Admission: AD | Admit: 2012-01-17 | Discharge: 2012-01-17 | Disposition: A | Discharge status: Home / Self Care | Payer: Medicaid Other | Source: Ambulatory Visit | Attending: Obstetrics & Gynecology | Admitting: Obstetrics & Gynecology

## 2012-01-17 ENCOUNTER — Inpatient Hospital Stay (HOSPITAL_COMMUNITY)
Admission: AD | Admit: 2012-01-17 | Discharge: 2012-01-17 | Disposition: A | Discharge status: Home / Self Care | Payer: Medicaid Other | Source: Ambulatory Visit | Attending: Obstetrics and Gynecology | Admitting: Obstetrics and Gynecology

## 2012-01-17 ENCOUNTER — Encounter (HOSPITAL_COMMUNITY): Payer: Self-pay | Admitting: *Deleted

## 2012-01-17 ENCOUNTER — Inpatient Hospital Stay (HOSPITAL_COMMUNITY): Payer: Medicaid Other

## 2012-01-17 ENCOUNTER — Other Ambulatory Visit: Payer: Self-pay | Admitting: Advanced Practice Midwife

## 2012-01-17 DIAGNOSIS — O209 Hemorrhage in early pregnancy, unspecified: Secondary | ICD-10-CM | POA: Insufficient documentation

## 2012-01-17 DIAGNOSIS — Z298 Encounter for other specified prophylactic measures: Secondary | ICD-10-CM | POA: Insufficient documentation

## 2012-01-17 DIAGNOSIS — Z2989 Encounter for other specified prophylactic measures: Secondary | ICD-10-CM | POA: Insufficient documentation

## 2012-01-17 LAB — ABO/RH: ABO/RH(D): O NEG

## 2012-01-17 LAB — URINALYSIS, ROUTINE W REFLEX MICROSCOPIC
Bilirubin Urine: NEGATIVE
Glucose, UA: NEGATIVE mg/dL
Ketones, ur: NEGATIVE mg/dL
Leukocytes, UA: NEGATIVE
Nitrite: NEGATIVE
Protein, ur: NEGATIVE mg/dL
Specific Gravity, Urine: <1.005 — ABNORMAL LOW (ref 1.005–1.030)
Urobilinogen, UA: 0.2 mg/dL (ref 0.0–1.0)
pH: 6 (ref 5.0–8.0)

## 2012-01-17 LAB — URINE MICROSCOPIC-ADD ON

## 2012-01-17 LAB — CBC
HCT: 37.8 % (ref 36.0–46.0)
Hemoglobin: 12.5 g/dL (ref 12.0–15.0)
MCH: 28.4 pg (ref 26.0–34.0)
MCHC: 33.1 g/dL (ref 30.0–36.0)
MCV: 85.9 fL (ref 78.0–100.0)
Platelets: 318 10*3/uL (ref 150–400)
RBC: 4.4 MIL/uL (ref 3.87–5.11)
RDW: 13.9 % (ref 11.5–15.5)
WBC: 8.9 10*3/uL (ref 4.0–10.5)

## 2012-01-17 LAB — WET PREP, GENITAL
Clue Cells Wet Prep HPF POC: NONE SEEN
Trich, Wet Prep: NONE SEEN
Yeast Wet Prep HPF POC: NONE SEEN

## 2012-01-17 LAB — DIFFERENTIAL
Basophils Absolute: 0 10*3/uL (ref 0.0–0.1)
Basophils Relative: 0 % (ref 0–1)
Eosinophils Absolute: 0.1 10*3/uL (ref 0.0–0.7)
Eosinophils Relative: 1 % (ref 0–5)
Lymphocytes Relative: 37 % (ref 12–46)
Lymphs Abs: 3.3 10*3/uL (ref 0.7–4.0)
Monocytes Absolute: 0.5 10*3/uL (ref 0.1–1.0)
Monocytes Relative: 6 % (ref 3–12)
Neutro Abs: 5 10*3/uL (ref 1.7–7.7)
Neutrophils Relative %: 56 % (ref 43–77)

## 2012-01-17 LAB — HCG, QUANTITATIVE, PREGNANCY: hCG, Beta Chain, Quant, S: 10899 m[IU]/mL — ABNORMAL HIGH (ref <5)

## 2012-01-17 LAB — POCT PREGNANCY, URINE: Preg Test, Ur: POSITIVE — AB

## 2012-01-17 MED ORDER — RHO D IMMUNE GLOBULIN 1500 UNIT/2ML IJ SOLN
300.0000 ug | Freq: Once | INTRAMUSCULAR | Status: AC
  Administered 2012-01-17: 300 ug via INTRAMUSCULAR

## 2012-01-17 NOTE — Discharge Instructions (Signed)
Threatened Miscarriage  A threatened miscarriage is a pregnancy that may end. It may be marked by bleeding during the first 20 weeks of pregnancy. Often, the pregnancy can continue without any more problems. You may be asked to stop:  Having sex (intercourse).   Having orgasms.   Using tampons.   Exercising.   Doing heavy physical activity and work.  HOME CARE   Your doctor may tell you to take bed rest and to stop activities and work.   Write down the number of pads you use each day. Write down how often you change pads. Write down how soaked they are.   Follow your doctor's advice for follow-up visits and tests.   If your blood type is Rh-negative and the father's blood is Rh-positive (or is not known), you may get a shot to protect the baby.   If you have a miscarriage, save all the tissue you pass in a container. Take the container to your doctor.  GET HELP RIGHT AWAY IF:   You have bad cramps or pain in your belly (abdomen), lower belly, or back.   You have a fever or chills.   Your bleeding gets worse or you pass large clots of blood or tissue. Save this tissue to show your doctor.   You feel lightheaded, weak, dizzy, or pass out (faint).   You have a gush of fluid from your vagina.  MAKE SURE YOU:   Understand these instructions.   Will watch your condition.   Will get help right away if you are not doing well or get worse.  Document Released: 06/27/2008 Document Revised: 07/04/2011 Document Reviewed: 07/31/2009 [employer] Patient Information 2012 [employer]. 

## 2012-01-17 NOTE — MAU Provider Note (Signed)
History     CSN: [id number]  Arrival date & time 01/17/12  0343   None     Chief Complaint  Patient presents with  . Vaginal Bleeding    HPI Suzanne Bullock is a 26 y.o. female @ approximately [redacted] weeks gestation who presents to MAU for vaginal  Bleeding and lower abdominal cramping in early pregnancy. Onset of bleeding approximately 1 hour prior to arrival. The history was provided by the patient.  Past Medical History  Diagnosis Date  . Hypertension 08/2008    NO MEDS  . Type o blood, rh negative     O-  . Urinary tract infection     Past Surgical History  Procedure Date  . Abortions     Family History  Problem Relation Age of Onset  . Diabetes Father   . Hypertension Father   . Cancer Maternal Grandmother     THROAT CA  . Hypertension Paternal Grandmother     History  Substance Use Topics  . Smoking status: Current Everyday Smoker -- 0.2 packs/day for 3 years    Types: Cigarettes  . Smokeless tobacco: Not on file  . Alcohol Use: No    OB History    Grav Para Term Preterm Abortions TAB SAB Ect Mult Living   5    4 1 3    0      Review of Systems  Constitutional: Negative for fever, chills, diaphoresis and fatigue.  HENT: Negative for ear pain, congestion, sore throat, facial swelling, neck pain, neck stiffness, dental problem and sinus pressure.   Eyes: Negative for photophobia, pain, discharge and visual disturbance.  Respiratory: Negative for cough, chest tightness and wheezing.   Cardiovascular: Negative for chest pain and palpitations.  Gastrointestinal: Positive for nausea, abdominal pain and diarrhea. Negative for vomiting, constipation and abdominal distention.  Genitourinary: Positive for frequency, vaginal bleeding and vaginal discharge. Negative for dysuria, urgency, flank pain and difficulty urinating.  Musculoskeletal: Negative for myalgias, back pain and gait problem.  Skin: Negative for color change and rash.  Neurological: Negative for  dizziness, speech difficulty, weakness, light-headedness, numbness and headaches.  Psychiatric/Behavioral: Negative for confusion and agitation.    Allergies  Review of patient's allergies indicates no known allergies.  Home Medications  No current outpatient prescriptions on file.  BP 138/89  Pulse 84  Temp 99 F (37.2 C) (Oral)  Resp 18  Ht 5\' 2"  (1.575 m)  Wt 153 lb (69.4 kg)  BMI 27.98 kg/m2  SpO2 100%  LMP 12/03/2010  Physical Exam  Nursing note and vitals reviewed. Constitutional: She is oriented to person, place, and time. She appears well-developed and well-nourished. No distress.  HENT:  Head: Normocephalic.  Eyes: EOM are normal.  Neck: Neck supple.  Cardiovascular: Normal rate.   Pulmonary/Chest: Effort normal.  Abdominal: Soft. There is no tenderness.  Genitourinary:       External genitalia without lesions. Small blood vaginal vault. Cervix closed, long, no CMT, no adnexal tenderness. Uterus slightly enlarged.   Musculoskeletal: Normal range of motion.  Neurological: She is alert and oriented to person, place, and time. No cranial nerve deficit.  Skin: Skin is warm and dry.  Psychiatric: Her behavior is normal. Judgment and thought content normal.   Results for orders placed during the hospital encounter of 01/17/12 (from the past 24 hour(s))  URINALYSIS, ROUTINE W REFLEX MICROSCOPIC     Status: Abnormal   Collection Time   01/17/12  4:06 AM      Component  Value Range   Color, Urine YELLOW  YELLOW   APPearance CLEAR  CLEAR   Specific Gravity, Urine <1.005 (*) 1.005 - 1.030   pH 6.0  5.0 - 8.0   Glucose, UA NEGATIVE  NEGATIVE mg/dL   Hgb urine dipstick MODERATE (*) NEGATIVE   Bilirubin Urine NEGATIVE  NEGATIVE   Ketones, ur NEGATIVE  NEGATIVE mg/dL   Protein, ur NEGATIVE  NEGATIVE mg/dL   Urobilinogen, UA 0.2  0.0 - 1.0 mg/dL   Nitrite NEGATIVE  NEGATIVE   Leukocytes, UA NEGATIVE  NEGATIVE  URINE MICROSCOPIC-ADD ON     Status: Normal   Collection  Time   01/17/12  4:06 AM      Component Value Range   Squamous Epithelial / LPF RARE  RARE   RBC / HPF 3-6  <3 RBC/hpf  CBC     Status: Normal   Collection Time   01/17/12  4:15 AM      Component Value Range   WBC 8.9  4.0 - 10.5 K/uL   RBC 4.40  3.87 - 5.11 MIL/uL   Hemoglobin 12.5  12.0 - 15.0 g/dL   HCT 40.9  81.1 - 91.4 %   MCV 85.9  78.0 - 100.0 fL   MCH 28.4  26.0 - 34.0 pg   MCHC 33.1  30.0 - 36.0 g/dL   RDW 78.2  95.6 - 21.3 %   Platelets 318  150 - 400 K/uL  DIFFERENTIAL     Status: Normal   Collection Time   01/17/12  4:15 AM      Component Value Range   Neutrophils Relative 56  43 - 77 %   Neutro Abs 5.0  1.7 - 7.7 K/uL   Lymphocytes Relative 37  12 - 46 %   Lymphs Abs 3.3  0.7 - 4.0 K/uL   Monocytes Relative 6  3 - 12 %   Monocytes Absolute 0.5  0.1 - 1.0 K/uL   Eosinophils Relative 1  0 - 5 %   Eosinophils Absolute 0.1  0.0 - 0.7 K/uL   Basophils Relative 0  0 - 1 %   Basophils Absolute 0.0  0.0 - 0.1 K/uL  HCG, QUANTITATIVE, PREGNANCY     Status: Abnormal   Collection Time   01/17/12  4:15 AM      Component Value Range   hCG, Beta Chain, Quant, S 10899 (*) <5 mIU/mL  ABO/RH     Status: Normal   Collection Time   01/17/12  4:15 AM      Component Value Range   ABO/RH(D) O NEG    POCT PREGNANCY, URINE     Status: Abnormal   Collection Time   01/17/12  4:15 AM      Component Value Range   Preg Test, Ur POSITIVE (*) NEGATIVE  WET PREP, GENITAL     Status: Abnormal   Collection Time   01/17/12  4:38 AM      Component Value Range   Yeast Wet Prep HPF POC NONE SEEN  NONE SEEN   Trich, Wet Prep NONE SEEN  NONE SEEN   Clue Cells Wet Prep HPF POC NONE SEEN  NONE SEEN   WBC, Wet Prep HPF POC FEW (*) NONE SEEN   US Ob Comp Less 14 Wks  01/17/2012  *RADIOLOGY REPORT*  Clinical Data: Vaginal bleeding; history of three spontaneous abortions.  OBSTETRIC <14 WK [place] AND TRANSVAGINAL OB US  Technique:  Both transabdominal and transvaginal ultrasound examinations were  performed for complete evaluation of the gestation as well as the maternal uterus, adnexal regions, and pelvic cul-de-sac.  Transvaginal technique was performed to assess early pregnancy.  Comparison:  Pelvic ultrasound performed 03/17/2011  Intrauterine gestational sac:  Visualized / mildly irregular shape. Yolk sac: Not well seen. Embryo: Yes Cardiac Activity: No Heart Rate: N/A  CRL: 2.9   mm  6   w  0   d         [place] EDC: 09/11/2012  Maternal uterus/adnexae: No subchorionic hemorrhage is seen.  A tiny 0.6 cm myometrial fibroid is noted.  The uterus is otherwise unremarkable in appearance.  During the course of the study, the gestational sac is seen sliding distally within the endometrial canal, with increasing distortion of the gestational sac, suspicious for spontaneous abortion.  The ovaries are unremarkable in appearance.  The right ovary measures 2.8 x 1.5 x 1.3 cm, while the left ovary measures 2.8 x 1.3 x 1.7 cm.  No suspicious adnexal masses are seen; there is no evidence for ovarian torsion.  IMPRESSION: Single intrauterine gestational sac noted, mildly irregular in shape.  This elongates and migrates distally during the course of the study, suspicious for a spontaneous abortion.  The embryo remains too small to characterize a heart rate.  Original Report Authenticated By: Tonia Ghent, M.D.   US Ob Transvaginal  01/17/2012  *RADIOLOGY REPORT*  Clinical Data: Vaginal bleeding; history of three spontaneous abortions.  OBSTETRIC <14 WK [place] AND TRANSVAGINAL OB US  Technique:  Both transabdominal and transvaginal ultrasound examinations were performed for complete evaluation of the gestation as well as the maternal uterus, adnexal regions, and pelvic cul-de-sac.  Transvaginal technique was performed to assess early pregnancy.  Comparison:  Pelvic ultrasound performed 03/17/2011  Intrauterine gestational sac:  Visualized / mildly irregular shape. Yolk sac: Not well seen. Embryo: Yes Cardiac Activity: No  Heart Rate: N/A  CRL: 2.9   mm  6   w  0   d         [place] EDC: 09/11/2012  Maternal uterus/adnexae: No subchorionic hemorrhage is seen.  A tiny 0.6 cm myometrial fibroid is noted.  The uterus is otherwise unremarkable in appearance.  During the course of the study, the gestational sac is seen sliding distally within the endometrial canal, with increasing distortion of the gestational sac, suspicious for spontaneous abortion.  The ovaries are unremarkable in appearance.  The right ovary measures 2.8 x 1.5 x 1.3 cm, while the left ovary measures 2.8 x 1.3 x 1.7 cm.  No suspicious adnexal masses are seen; there is no evidence for ovarian torsion.  IMPRESSION: Single intrauterine gestational sac noted, mildly irregular in shape.  This elongates and migrates distally during the course of the study, suspicious for a spontaneous abortion.  The embryo remains too small to characterize a heart rate.  Original Report Authenticated By: Tonia Ghent, M.D.   Assessment: Bleeding in early pregnancy ? SAB  Plan:  Will schedule for follow up ultrasound for viabiltiy   Miscarriage precautions given  Note:  Patient left prior to Rhogam work up. Will need to Return for Rhogam. Wynelle Bourgeois, CNM will call the patient to discuss need for return.  ED Course  Procedures  MDM

## 2012-01-17 NOTE — MAU Provider Note (Signed)
  History     CSN: [id number]  Arrival date and time: 01/17/12 1833   None     No chief complaint on file.  HPI Here for Rhig administration.  Past Medical History  Diagnosis Date  . Hypertension 08/2008    NO MEDS  . Type o blood, rh negative     O-  . Urinary tract infection    Allergies: No Known Allergies  No prescriptions prior to admission    ROS n/a Physical Exam   Last menstrual period 12/03/2010.  Physical Exam Not indicated MAU Course  Procedures Assessment and Plan  Rhophylac as per protocol  [facility] 01/17/2012, 6:43 PM

## 2012-01-17 NOTE — MAU Note (Signed)
Pt reports [facility], reports onset of vaginal bleeding about 1 hour ago. 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 Denies pain

## 2012-01-17 NOTE — MAU Note (Signed)
Patient instructed to follow-up with [facility] next week as scheduled. Return as needed for increase in amount of vaginal bleeding or abdominal cramping. Patient verbalizes understanding of signs and symptoms to be aware of.

## 2012-01-17 NOTE — MAU Note (Signed)
Rhophylac information sheet given to the patient in the lobby. Explained time required to process this order.

## 2012-01-18 LAB — RH IG WORKUP (INCLUDES ABO/RH)
ABO/RH(D): O NEG
Antibody Screen: NEGATIVE
Gestational Age(Wks): 6
Unit division: 0

## 2012-01-18 LAB — GC/CHLAMYDIA PROBE AMP, GENITAL
Chlamydia, DNA Probe: NEGATIVE
GC Probe Amp, Genital: NEGATIVE

## 2012-01-20 NOTE — MAU Provider Note (Signed)
Agree with above note.  Suzanne Bullock 01/20/2012 12:33 PM

## 2012-02-10 ENCOUNTER — Other Ambulatory Visit: Payer: Self-pay | Admitting: Obstetrics & Gynecology

## 2012-03-03 ENCOUNTER — Encounter (HOSPITAL_COMMUNITY): Payer: Self-pay | Admitting: *Deleted

## 2012-03-03 ENCOUNTER — Inpatient Hospital Stay (HOSPITAL_COMMUNITY)
Admission: AD | Admit: 2012-03-03 | Discharge: 2012-03-03 | Disposition: A | Discharge status: Home / Self Care | Payer: Medicaid Other | Source: Ambulatory Visit | Attending: Obstetrics | Admitting: Obstetrics

## 2012-03-03 ENCOUNTER — Inpatient Hospital Stay (HOSPITAL_COMMUNITY): Payer: Medicaid Other

## 2012-03-03 DIAGNOSIS — O021 Missed abortion: Secondary | ICD-10-CM | POA: Insufficient documentation

## 2012-03-03 DIAGNOSIS — O034 Incomplete spontaneous abortion without complication: Secondary | ICD-10-CM

## 2012-03-03 DIAGNOSIS — O039 Complete or unspecified spontaneous abortion without complication: Secondary | ICD-10-CM

## 2012-03-03 HISTORY — DX: Family history of other specified conditions: Z84.89

## 2012-03-03 MED ORDER — OXYCODONE-ACETAMINOPHEN 5-325 MG PO TABS: 2.0000 | ORAL_TABLET | Freq: Once | ORAL | Status: DC | PRN

## 2012-03-03 MED ORDER — KETOROLAC TROMETHAMINE 60 MG/2ML IM SOLN
60.0000 mg | Freq: Once | INTRAMUSCULAR | Status: AC
  Administered 2012-03-03: 60 mg via INTRAMUSCULAR
  Filled 2012-03-03: qty 2

## 2012-03-03 MED ORDER — KETOROLAC TROMETHAMINE 10 MG PO TABS: 10.0000 mg | ORAL_TABLET | Freq: Four times a day (QID) | ORAL | Status: AC | PRN

## 2012-03-03 MED ORDER — MORPHINE SULFATE 4 MG/ML IJ SOLN
4.0000 mg | Freq: Once | INTRAMUSCULAR | Status: DC
  Filled 2012-03-03: qty 1

## 2012-03-03 NOTE — MAU Provider Note (Signed)
History     CSN: [id number]  Arrival date and time: 03/03/12 1191   First Provider Initiated Contact with Patient 03/03/12 [phone]      Chief Complaint  Patient presents with  . Abdominal Pain   HPI  Patient states she has been diagnosed with a fetal demise at [facility] last week. Reports she started having cramping last night. Passed a clot on Saturday and is having a little spotting now.  Pt seen in MAU at the end of June and given rhogam for bleeding during pregnancy.     Past Medical History  Diagnosis Date  . Hypertension 08/2008    NO MEDS  . Type o blood, rh negative     O-  . Urinary tract infection   . FH: multiple miscarriages or stillbirths     Past Surgical History  Procedure Date  . Abortions   . Wisdom tooth extraction     Family History  Problem Relation Age of Onset  . Diabetes Father   . Hypertension Father   . Cancer Maternal Grandmother     THROAT CA  . Hypertension Paternal Grandmother     History  Substance Use Topics  . Smoking status: Current Everyday Smoker -- 0.2 packs/day for 3 years    Types: Cigarettes  . Smokeless tobacco: Not on file  . Alcohol Use: No    Allergies: No Known Allergies  No prescriptions prior to admission    Review of Systems  Gastrointestinal: Positive for abdominal pain.  Genitourinary:       Spotting of blood  All other systems reviewed and are negative.   Physical Exam   Blood pressure 152/94, pulse 74, temperature 98.9 F (37.2 C), temperature source Oral, resp. rate 18, height 5' 2.5" (1.588 m), weight 164 lb 3.2 oz (74.481 kg), last menstrual period 12/03/2011, SpO2 100.00%.  Physical Exam  Constitutional: She is oriented to person, place, and time. She appears well-developed and well-nourished.       Appears uncomfortable  HENT:  Head: Normocephalic.  Neck: Normal range of motion. Neck supple.  Cardiovascular: Normal rate, regular rhythm and normal heart sounds.   Respiratory: Effort normal and  breath sounds normal. No respiratory distress.  GI: Soft. She exhibits no mass. There is no tenderness. There is no rebound and no guarding.  Genitourinary: Cervix exhibits no discharge (nothing seen in cervical os). There is bleeding (scant) around the vagina.  Neurological: She is alert and oriented to person, place, and time.  Skin: Skin is warm and dry.    MAU Course  Procedures  IM Toradol 60 mg OB Ultrasound  Report given to V. Katrinka Blazing who assumes care of patient at 53.  Assessment and Plan    [facility] 03/03/2012, 7:56 AM  Assumed care of patient at 8:09 AM.    Patient reported scant bleeding and decreased abdominal pain upon returning from ultrasound. Discussed ultrasound findings and offered repeat pelvic exam to see if gestational sac could be removed to possibly reduce the cramping. Patient declined. Per consult with Dr. Clearance Coots, patient does not need to keep appointment this afternoon. Expected management.  Assessment: 1. Inevitable abortion    Plan Discharge home per consult with Dr. Clearance Coots Support given Bleeding precautions Follow-up Information    Follow up with HARPER,CHARLES A, MD in 2 weeks.   Contact information:   [address] [place] [address] [phone]       Follow up with [facility]. (As needed if symptoms  worsen)    Contact information:   [address] [place] [address] [phone]        Medication List  As of 03/04/2012  2:59 PM   START taking these medications         ketorolac 10 MG tablet   Commonly known as: TORADOL   Take 1 tablet (10 mg total) by mouth every 6 (six) hours as needed for pain.          Where to get your medications    These are the prescriptions that you need to pick up.   You may get these medications from any pharmacy.         ketorolac 10 MG tablet           [place], [place] 03/03/2012 2:59 PM

## 2012-03-03 NOTE — MAU Note (Signed)
Pt unable to take morphine injection, is driving herself.  CNM informed.

## 2012-03-03 NOTE — MAU Note (Signed)
Patient states she has been diagnosed with a miscarriage in the office last week. States she started having cramping last night. Passed a clot on Saturday and is having a little spotting now.

## 2012-03-05 ENCOUNTER — Encounter (HOSPITAL_COMMUNITY): Payer: Self-pay | Admitting: *Deleted

## 2012-03-05 ENCOUNTER — Inpatient Hospital Stay (EMERGENCY_DEPARTMENT_HOSPITAL)
Admission: AD | Admit: 2012-03-05 | Discharge: 2012-03-06 | Disposition: A | Discharge status: Home / Self Care | Payer: Medicaid Other | Source: Ambulatory Visit | Attending: Obstetrics | Admitting: Obstetrics

## 2012-03-05 ENCOUNTER — Encounter (HOSPITAL_COMMUNITY): Payer: Self-pay | Admitting: Emergency Medicine

## 2012-03-05 ENCOUNTER — Emergency Department (HOSPITAL_COMMUNITY)
Admission: EM | Admit: 2012-03-05 | Discharge: 2012-03-05 | Disposition: A | Discharge status: Home / Self Care | Payer: Medicaid Other | Source: Home / Self Care | Attending: Emergency Medicine | Admitting: Emergency Medicine

## 2012-03-05 DIAGNOSIS — O039 Complete or unspecified spontaneous abortion without complication: Secondary | ICD-10-CM

## 2012-03-05 DIAGNOSIS — L02214 Cutaneous abscess of groin: Secondary | ICD-10-CM

## 2012-03-05 LAB — CBC
HCT: 39.3 % (ref 36.0–46.0)
Hemoglobin: 12.9 g/dL (ref 12.0–15.0)
MCH: 28.8 pg (ref 26.0–34.0)
MCHC: 32.8 g/dL (ref 30.0–36.0)
MCV: 87.7 fL (ref 78.0–100.0)
Platelets: 288 10*3/uL (ref 150–400)
RBC: 4.48 MIL/uL (ref 3.87–5.11)
RDW: 14.9 % (ref 11.5–15.5)
WBC: 9.9 10*3/uL (ref 4.0–10.5)

## 2012-03-05 MED ORDER — ONDANSETRON HCL 4 MG/2ML IJ SOLN
4.0000 mg | Freq: Once | INTRAMUSCULAR | Status: AC
  Administered 2012-03-05: 4 mg via INTRAVENOUS
  Filled 2012-03-05: qty 2

## 2012-03-05 MED ORDER — LACTATED RINGERS IV BOLUS (SEPSIS)
1000.0000 mL | Freq: Once | INTRAVENOUS | Status: AC
  Administered 2012-03-05: 1000 mL via INTRAVENOUS

## 2012-03-05 MED ORDER — MISOPROSTOL 200 MCG PO TABS
800.0000 ug | ORAL_TABLET | Freq: Once | ORAL | Status: AC
  Administered 2012-03-05: 800 ug via RECTAL
  Filled 2012-03-05: qty 4

## 2012-03-05 MED ORDER — PROMETHAZINE HCL 25 MG PO TABS: 12.5000 mg | ORAL_TABLET | Freq: Four times a day (QID) | ORAL | Status: DC | PRN

## 2012-03-05 MED ORDER — HYDROMORPHONE HCL PF 1 MG/ML IJ SOLN
1.0000 mg | Freq: Once | INTRAMUSCULAR | Status: AC
  Administered 2012-03-05: 1 mg via INTRAVENOUS
  Filled 2012-03-05: qty 1

## 2012-03-05 MED ORDER — OXYCODONE-ACETAMINOPHEN 5-325 MG PO TABS: 1.0000 | ORAL_TABLET | ORAL | Status: AC | PRN

## 2012-03-05 NOTE — MAU Provider Note (Signed)
History     CSN: [id number]  Arrival date and time: 03/05/12 1929   None     Chief Complaint  Patient presents with  . Threatened Miscarriage   HPI Suzanne Bullock is a 26 y.o. female who presents to MAU with severe abdominal pain. She has a known 6.5 week IUFD and has planned expectant management. Today she began cramping and bleeding. The pain has gotten so bad she could not stand it any longer and came to MAU. Associated symptoms include low back pain. The history was provided by the patient and her medial record.  OB History    Grav Para Term Preterm Abortions TAB SAB Ect Mult Living   5    4 1 3    0      Past Medical History  Diagnosis Date  . Hypertension 08/2008    NO MEDS  . Type o blood, rh negative     O-  . Urinary tract infection   . FH: multiple miscarriages or stillbirths     Past Surgical History  Procedure Date  . Abortions   . Wisdom tooth extraction     Family History  Problem Relation Age of Onset  . Diabetes Father   . Hypertension Father   . Cancer Maternal Grandmother     THROAT CA  . Hypertension Paternal Grandmother     History  Substance Use Topics  . Smoking status: Current Everyday Smoker -- 0.2 packs/day for 3 years    Types: Cigarettes  . Smokeless tobacco: Not on file  . Alcohol Use: No    Allergies: No Known Allergies  Prescriptions prior to admission  Medication Sig Dispense Refill  . ketorolac (TORADOL) 10 MG tablet Take 1 tablet (10 mg total) by mouth every 6 (six) hours as needed for pain.  20 tablet  0    ROS: As stated in HPI   Physical Exam  Constitutional: She is oriented to person, place, and time. She appears well-developed and well-nourished. No distress.  HENT:  Head: Normocephalic and atraumatic.  Eyes: EOM are normal.  Neck: Neck supple.  Cardiovascular: Normal rate.   Respiratory: Effort normal.  GI: Soft. There is no tenderness.  Genitourinary: Vagina normal.  Musculoskeletal: Normal range of  motion.  Neurological: She is alert and oriented to person, place, and time.  Skin: Skin is warm and dry.  Psychiatric: She has a normal mood and affect. Her behavior is normal. Judgment and thought content normal.   Results for orders placed during the hospital encounter of 03/05/12 (from the past 24 hour(s))  CBC     Status: Normal   Collection Time   03/05/12  8:01 PM      Component Value Range   WBC 9.9  4.0 - 10.5 K/uL   RBC 4.48  3.87 - 5.11 MIL/uL   Hemoglobin 12.9  12.0 - 15.0 g/dL   HCT 13.0  86.5 - 78.4 %   MCV 87.7  78.0 - 100.0 fL   MCH 28.8  26.0 - 34.0 pg   MCHC 32.8  30.0 - 36.0 g/dL   RDW 69.6  29.5 - 28.4 %   Platelets 288  150 - 400 K/uL   MAU Course: Consult with Dr. Gaynell Face @ 20:00 will give Cytotec and observe  Procedures Assessment: SAB  Plan:  IV LR   Dilaudid 1 mg IV   Zofran 4 mg IV   Cytotec 800 mcg. PR   Observe       Early  Intrauterine Pregnancy Failure  _x__  Documented intrauterine pregnancy failure less than or equal to [redacted] weeks gestation  _x__  No serious current illness  x___  Baseline Hgb greater than or equal to 10g/dl  _x__  Patient has easily accessible transportation to the hospital  __x_  Clear preference  _x__  Practitioner/physician deems patient reliable  __x_  Counseling by practitioner or physician  __x_  Patient education by RN  __x_  Consent form signed  ___  Rho-Gam given by RN if indicated  __x_ Medication dispensed   _x__   Cytotec 800 mcg  __   Intravaginally by patient at home         __   Intravaginally by RN in MAU        __   Rectally by patient at home        _x_   Rectally by RN in MAU  _x__  Ibuprofen 600 mg 1 tablet by mouth every 6 hours as needed #30  _x__  Hydrocodone/acetaminophen 5/325 mg by mouth every 4 to 6 hours as needed  _x__  Phenergan 12.5 mg by mouth every 4 hours as needed for nausea   Kamira Mellette, RN, FNP, [facility] 03/05/2012, 8:31 PM   @ 21:56 patient is feeling better. Will d/c  home with pain medication and instructions re: Cytotec and SAB. I have reviewed this patient's vital signs, nurses notes, appropriate labs and imaging. I have discussed medication and plan of care in detail with the patient and she voices understanding. Follow-up Information    Follow up with HARPER,CHARLES A, MD. Schedule an appointment as soon as possible for a visit in 1 day.   Contact information:   [address] [place] [address] [phone]         Medication List  As of 03/05/2012 10:04 PM   START taking these medications         oxyCODONE-acetaminophen 5-325 MG per tablet   Commonly known as: PERCOCET/ROXICET   Take 1 tablet by mouth every 4 (four) hours as needed for pain.      promethazine 25 MG tablet   Commonly known as: PHENERGAN   Take 0.5 tablets (12.5 mg total) by mouth every 6 (six) hours as needed for nausea.         CONTINUE taking these medications         ketorolac 10 MG tablet   Commonly known as: TORADOL   Take 1 tablet (10 mg total) by mouth every 6 (six) hours as needed for pain.          Where to get your medications    These are the prescriptions that you need to pick up. We sent them to a specific pharmacy, so you will need to go there to get them.   [facility] [id number] [address], [place] - [address] AT [facility] [address]    [address] [place] [address]    Phone: [phone]        promethazine 25 MG tablet         You may get these medications from any pharmacy.         oxyCODONE-acetaminophen 5-325 MG per tablet

## 2012-03-05 NOTE — ED Notes (Signed)
Pt alert, nad, c/o "boil" to left groin, onset a week ago, denies open or drainage, resp even unlabored, skin pwd

## 2012-03-05 NOTE — ED Provider Notes (Signed)
History     CSN: [id number]  Arrival date & time 03/05/12  0214   First MD Initiated Contact with Patient 03/05/12 [phone]      Chief Complaint  Patient presents with  . Wound Check    (Consider location/radiation/quality/duration/timing/severity/associated sxs/prior treatment) HPI Comments: Abscess: Patient presents for evaluation of a cutaneous abscess. Lesion is located in the left inguinal region. Onset was 2 days ago. Symptoms have gradually worsened. Abscess has associated symptoms of pain. Patient does have previous history of cutaneous abscesses. Patient does not have diabetes.    Patient is a 26 y.o. female presenting with wound check. The history is provided by the patient.  Wound Check     Past Medical History  Diagnosis Date  . Hypertension 08/2008    NO MEDS  . Type o blood, rh negative     O-  . Urinary tract infection   . FH: multiple miscarriages or stillbirths     Past Surgical History  Procedure Date  . Abortions   . Wisdom tooth extraction     Family History  Problem Relation Age of Onset  . Diabetes Father   . Hypertension Father   . Cancer Maternal Grandmother     THROAT CA  . Hypertension Paternal Grandmother     History  Substance Use Topics  . Smoking status: Current Everyday Smoker -- 0.2 packs/day for 3 years    Types: Cigarettes  . Smokeless tobacco: Not on file  . Alcohol Use: No    OB History    Grav Para Term Preterm Abortions TAB SAB Ect Mult Living   5    4 1 3    0      Review of Systems  Constitutional: Negative for fever, chills, diaphoresis and activity change.       Denies night sweats  HENT: Negative for neck stiffness.   Eyes: Negative for visual disturbance.  Respiratory: Negative for shortness of breath.   Cardiovascular: Negative for chest pain.  Gastrointestinal: Negative for abdominal pain.  Genitourinary: Negative for dysuria, urgency and frequency.  Musculoskeletal: Negative for gait problem.  Skin:  Negative for color change and rash.  Neurological: Negative for dizziness, light-headedness and headaches.  Hematological: Negative for adenopathy.  All other systems reviewed and are negative.    Allergies  Review of patient's allergies indicates no known allergies.  Home Medications   Current Outpatient Rx  Name Route Sig Dispense Refill  . KETOROLAC TROMETHAMINE 10 MG PO TABS Oral Take 1 tablet (10 mg total) by mouth every 6 (six) hours as needed for pain. 20 tablet 0    BP 126/79  Pulse 80  Temp 98 F (36.7 C)  Resp 16  SpO2 99%  LMP 12/03/2011  Physical Exam  Nursing note and vitals reviewed. Constitutional: She is oriented to person, place, and time. She appears well-developed and well-nourished. She does not have a sickly appearance. She does not appear ill. No distress.  HENT:  Head: Normocephalic and atraumatic.  Eyes: Conjunctivae and EOM are normal.  Neck: Normal range of motion. Neck supple.  Cardiovascular: Normal rate and regular rhythm.   Pulmonary/Chest: Effort normal and breath sounds normal.  Musculoskeletal: She exhibits no edema.  Lymphadenopathy:       Head (right side): No submental, no preauricular and no posterior auricular adenopathy present.       Head (left side): No submental, no submandibular, no preauricular and no posterior auricular adenopathy present.    She has no axillary adenopathy.  Neurological: She is alert and oriented to person, place, and time.  Skin: Skin is warm and dry. No rash noted. She is not diaphoretic.       3cm sized abscess located on left inguinal area. Extreme tenderness to palpation. Not currently draining. Abscess is fluctuant without warmth, surrounding erythema, or induration.    ED Course  Procedures (including critical care time)  Labs Reviewed - No data to display US Ob Transvaginal  03/03/2012  OBSTETRICAL ULTRASOUND: This exam was performed within a [facility]. The OB US report was  generated in the [facility] system, and faxed to the ordering physician.   This report is also available in [employer] and in the [employer]. See [facility] Obstetric US report.     1. Soft tissue abscess of inguinal region     INCISION AND DRAINAGE Performed by: Jaci Carrel Consent: Verbal consent obtained. Risks and benefits: risks, benefits and alternatives were discussed Type: abscess  Body area: L inguinal area  Anesthesia: local infiltration  Local anesthetic: lidocaine 2% wo epinephrine  Anesthetic total: 2 ml  Complexity: complex Blunt dissection to break up loculations  Drainage: purulent  Drainage amount: moderate  Packing material: None   Patient tolerance: Patient tolerated the procedure well with no immediate complications.     MDM  Patient with skin abscess amenable to incision and drainage.  Abscess was not large enough to warrant packing, wound recheck in 2 days. No signs of cellulitis is surrounding skin.  Will d/c to home.  No antibiotic therapy is indicated.         Jaci Carrel, [place] 03/05/12 [phone]

## 2012-03-06 ENCOUNTER — Inpatient Hospital Stay (HOSPITAL_COMMUNITY)
Admission: AD | Admit: 2012-03-06 | Discharge: 2012-03-06 | Disposition: A | Discharge status: Home / Self Care | Payer: Medicaid Other | Source: Ambulatory Visit | Attending: Obstetrics | Admitting: Obstetrics

## 2012-03-06 DIAGNOSIS — O039 Complete or unspecified spontaneous abortion without complication: Secondary | ICD-10-CM

## 2012-03-06 NOTE — ED Provider Notes (Signed)
Medical screening examination/treatment/procedure(s) were performed by non-physician practitioner and as supervising physician I was immediately available for consultation/collaboration.  Pearley Baranek, MD 03/06/12 0847 

## 2012-03-06 NOTE — Progress Notes (Signed)
Pt left MAU ambulating, boyfriend to pick pt up.

## 2012-03-06 NOTE — MAU Note (Signed)
Pt states she started having pain again after leaving MAU an hour ago. Pt states she threw up her pain medication after taking it and threw up blood

## 2012-03-06 NOTE — MAU Provider Note (Signed)
Suzanne Bullock is a 26 y.o. female who was evaluated earlier tonight for SAB. She was given Cytotec and Percocet. She returned @ 02:45 am stating that she began having heavy bleeding and severe cramping.  Exam: BP 140/81  Pulse 68  Temp 98.4 F (36.9 C) (Oral)  Resp 20  LMP 12/03/2011  Pelvic exam: Moderate blood with clots vaginal vault. Cervix open with tissue. Using ring forceps tissue removed. After tissue removed patient states cramping has stopped.   Assessment: SAB  Plan:  Will observe bleeding and if decreases will d/c home to follow up in the office with Dr. Clearance Coots.

## 2012-03-09 ENCOUNTER — Other Ambulatory Visit: Payer: Self-pay | Admitting: Obstetrics

## 2012-03-10 ENCOUNTER — Inpatient Hospital Stay (HOSPITAL_COMMUNITY)
Admission: AD | Admit: 2012-03-10 | Discharge: 2012-03-10 | Disposition: A | Discharge status: Home / Self Care | Payer: Medicaid Other | Source: Ambulatory Visit | Attending: Obstetrics | Admitting: Obstetrics

## 2012-03-10 DIAGNOSIS — Z2989 Encounter for other specified prophylactic measures: Secondary | ICD-10-CM | POA: Insufficient documentation

## 2012-03-10 DIAGNOSIS — Z298 Encounter for other specified prophylactic measures: Secondary | ICD-10-CM | POA: Insufficient documentation

## 2012-03-10 MED ORDER — RHO D IMMUNE GLOBULIN 1500 UNIT/2ML IJ SOLN
300.0000 ug | Freq: Once | INTRAMUSCULAR | Status: AC
  Administered 2012-03-10: 300 ug via INTRAMUSCULAR
  Filled 2012-03-10: qty 2

## 2012-03-10 NOTE — MAU Note (Signed)
Rhophylac information sheet given to the patient. Explained the process of 1 1/2 hours required. Patient may leave after blood is drawn and return today for the injection. Patient verbalized understanding.

## 2012-03-11 LAB — RH IG WORKUP (INCLUDES ABO/RH)
ABO/RH(D): O NEG
Antibody Screen: POSITIVE
DAT, IgG: NEGATIVE
Gestational Age(Wks): 6
Unit division: 0

## 2012-03-19 MED ORDER — RHO D IMMUNE GLOBULIN 1500 UNIT/2ML IJ SOLN: 300.0000 ug | Freq: Once | INTRAMUSCULAR | Status: DC

## 2012-03-21 IMAGING — US US OB TRANSVAGINAL
1 series · 14 of 28 positions shown · non-contrast
Comparison: none

[Series 1: us ob transvaginal · 14 of 29 slices shown]
[im 2/29]
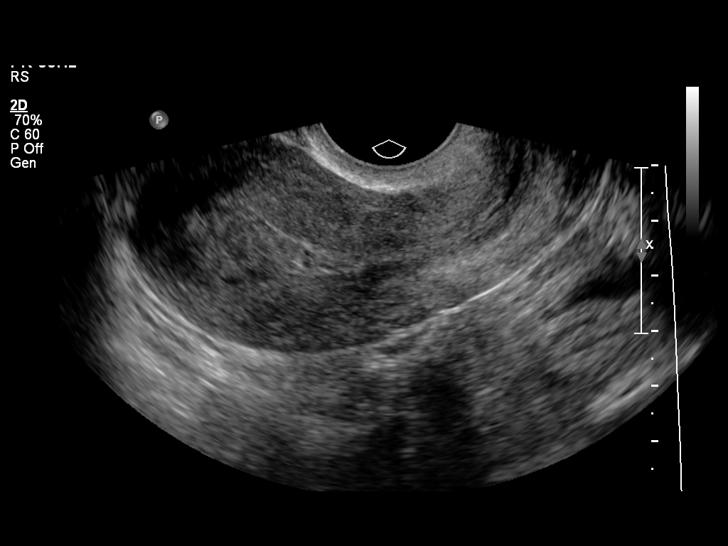
[im 4/29]
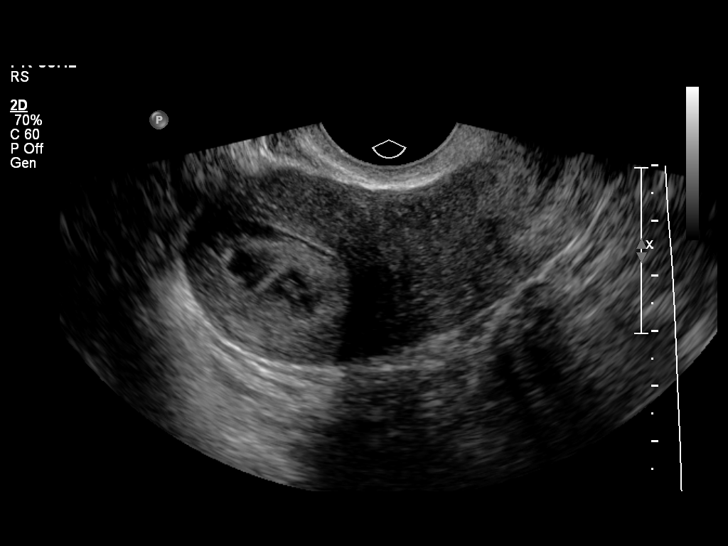
[im 6/29]
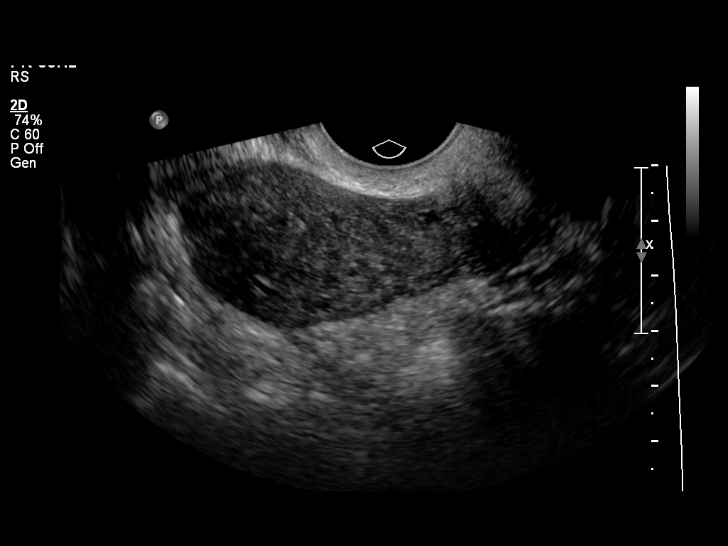
[im 8/29]
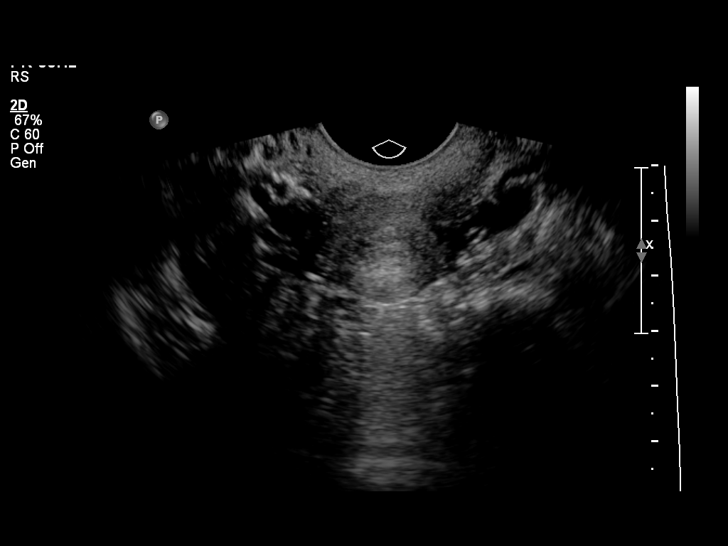
[im 10/29]
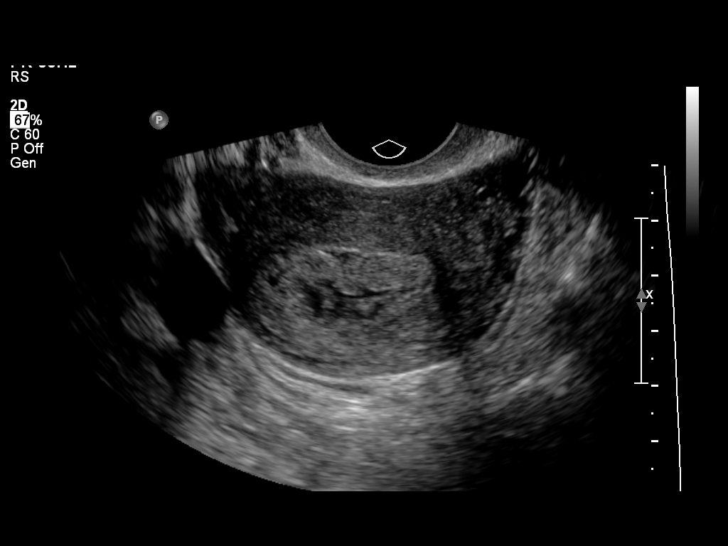
[im 12/29]
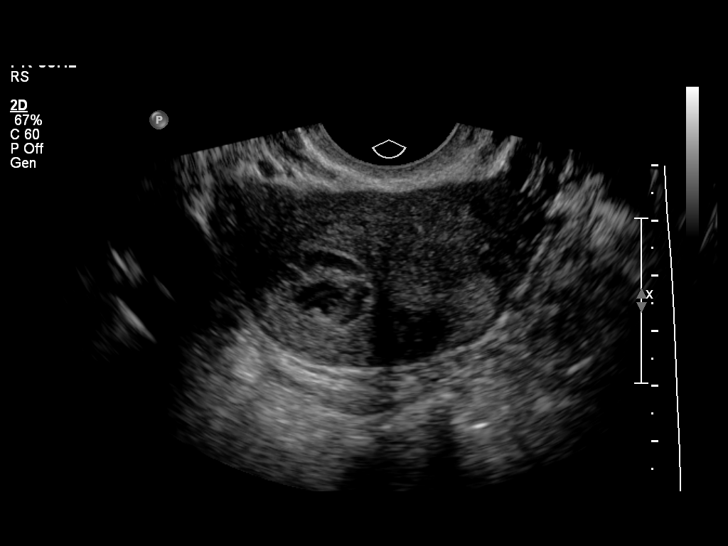
[im 14/29]
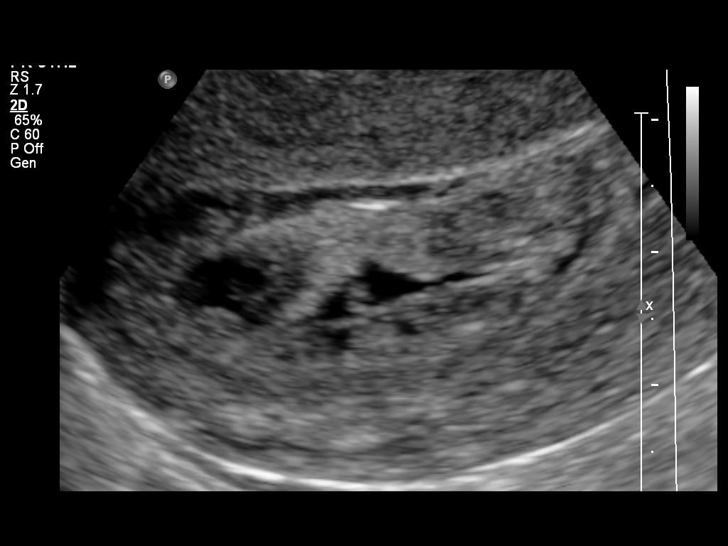
[im 16/29]
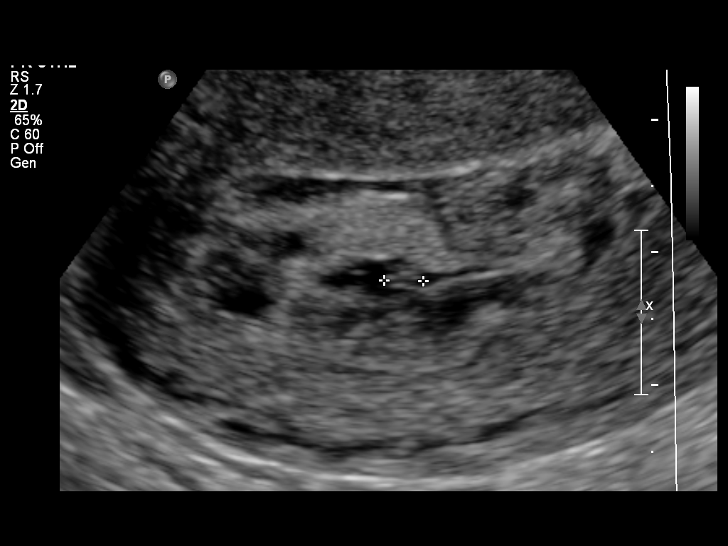
[im 18/29]
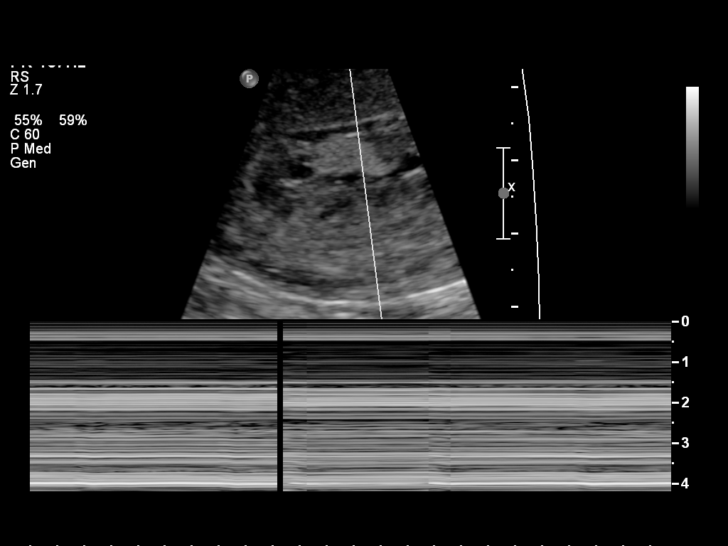
[im 20/29]
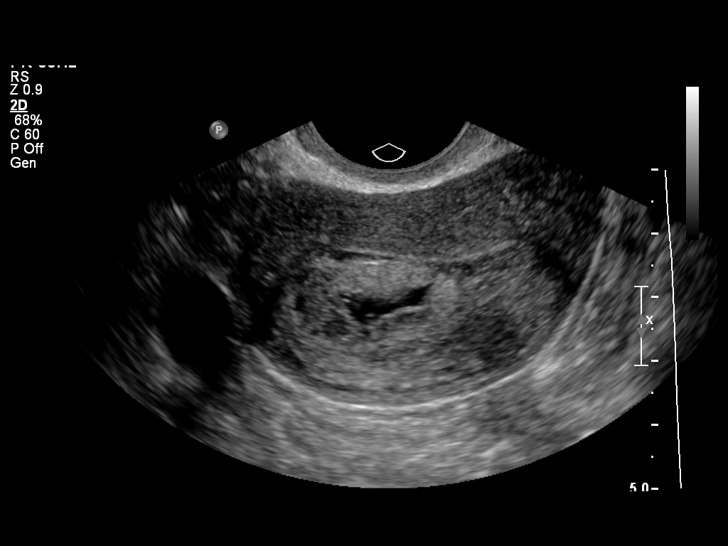
[im 22/29]
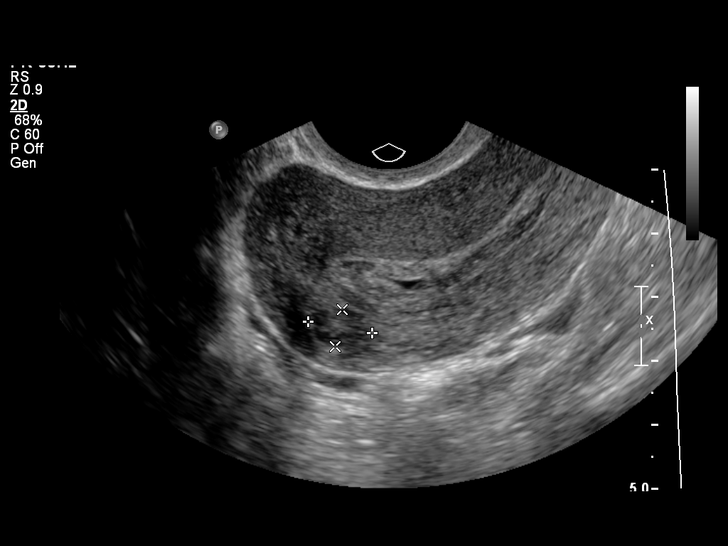
[im 24/29]
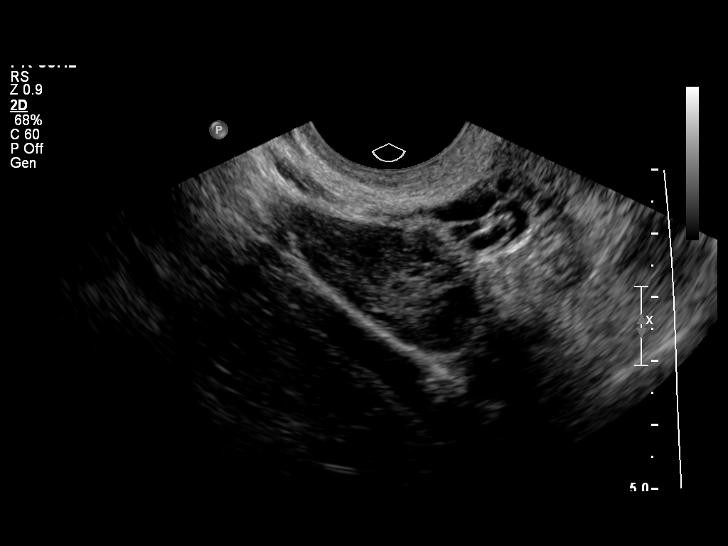
[im 26/29]
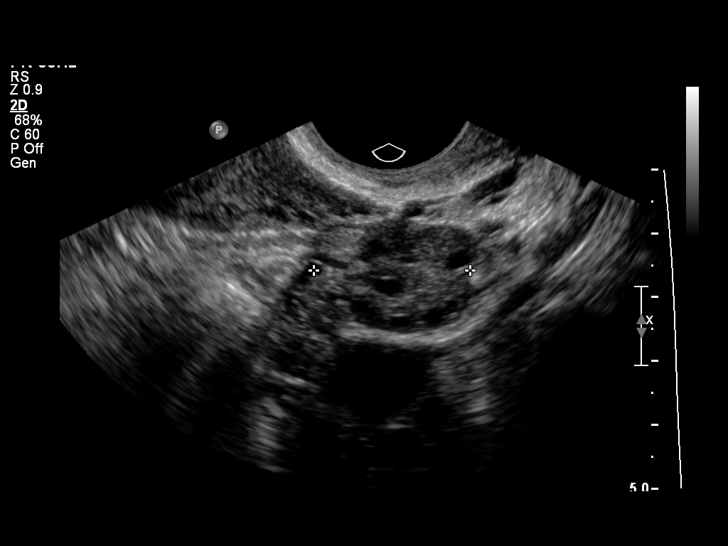
[im 29/29]
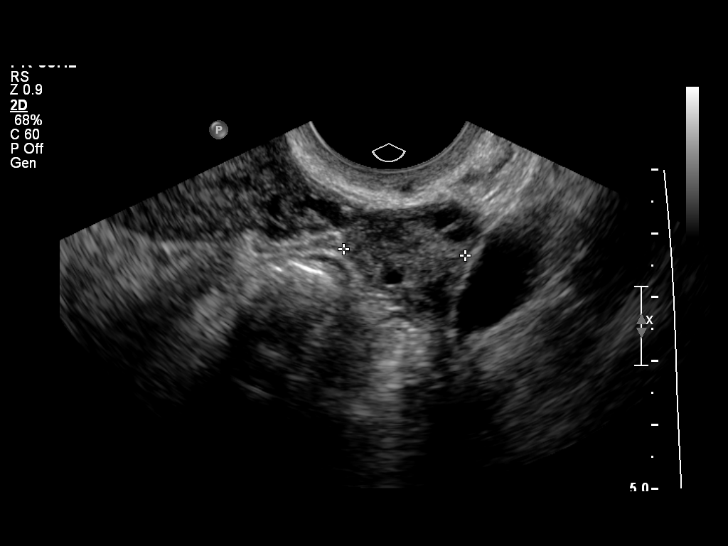

[14 of 28 positions shown; findings below may reference images not displayed]

OBSTETRICS REPORT

Procedures

 US OB TRANSVAGINAL                                    76817.0
Indications

 Vaginal bleeding, unknown etiology
Fetal Evaluation

 Preg. Location:    Intrauterine
 Gest. Sac:         Collapsed, large
                    subchorionic bleed
 Yolk Sac:          Visualized
 Fetal Pole:        Visualized
 Cardiac Activity:  Absent
Biometry

 CRL:      2.8  mm     G. Age:  5w 6d                  EDD:    11/11/11
Gestational Age

 LMP:           9w 5d         Date:  01/08/11                 EDD:   10/15/11
 Best:          9w 5d      Det. By:  LMP  (01/08/11)          EDD:   10/15/11
Cervix Uterus Adnexa

 Cervix:       Normal appearance by transvaginal scan

 Left Ovary:    Within normal limits.
 Right Ovary:   Within normal limits.
 Adnexa:     No abnormality visualized.
Impression

 Comparison is now made with exam on 02/18/11:
 Lack of growth since prior exam. Now there is no cardiac
 activity. Findings are consistent with failed IUP.
 The findings are discussed with [place] in [facility].
 questions or concerns.

## 2012-08-05 ENCOUNTER — Encounter (HOSPITAL_COMMUNITY): Payer: Self-pay | Admitting: Emergency Medicine

## 2012-08-05 ENCOUNTER — Emergency Department (HOSPITAL_COMMUNITY)
Admission: EM | Admit: 2012-08-05 | Discharge: 2012-08-05 | Disposition: A | Discharge status: Home / Self Care | Payer: Medicaid Other | Attending: Emergency Medicine | Admitting: Emergency Medicine

## 2012-08-05 DIAGNOSIS — I10 Essential (primary) hypertension: Secondary | ICD-10-CM | POA: Insufficient documentation

## 2012-08-05 DIAGNOSIS — IMO0002 Reserved for concepts with insufficient information to code with codable children: Secondary | ICD-10-CM | POA: Insufficient documentation

## 2012-08-05 DIAGNOSIS — Z79899 Other long term (current) drug therapy: Secondary | ICD-10-CM | POA: Insufficient documentation

## 2012-08-05 DIAGNOSIS — Z87440 Personal history of urinary (tract) infections: Secondary | ICD-10-CM | POA: Insufficient documentation

## 2012-08-05 DIAGNOSIS — L02419 Cutaneous abscess of limb, unspecified: Secondary | ICD-10-CM

## 2012-08-05 DIAGNOSIS — F17200 Nicotine dependence, unspecified, uncomplicated: Secondary | ICD-10-CM | POA: Insufficient documentation

## 2012-08-05 NOTE — ED Notes (Signed)
Patient has a boil in her right axilla region. The patient reports that it is draining some but still painful

## 2012-08-05 NOTE — ED Provider Notes (Signed)
History   This chart was scribed for Suzanne Gourd, PA-C working with Celene Kras, MD by Charolett Bumpers, ED Scribe. This patient was seen in room WTR8/WTR8 and the patient's care was started at 2208.   CSN: [id number]  Arrival date & time 08/05/12  2159   First MD Initiated Contact with Patient 08/05/12 2208      Chief Complaint  Patient presents with  . Recurrent Skin Infections   The history is provided by the patient. No language interpreter was used.   Suzanne Bullock is a 27 y.o. female who presents to the Emergency Department complaining of constant, gradually worsening abscess to her right axilla. She states she had had an abscess in the same area in the past, but had never enlarged. She states a few days ago, she noticed the abscess started enlarging. She states it burst and began draining some. She states since then, it has decreased in size some but remains painful. She denies any fever or chills. She states that she normally shaves in that area, last shaved 3 days ago. She has a h/o abscesses in different locations the past as well.   Past Medical History  Diagnosis Date  . Hypertension 08/2008    NO MEDS  . Type o blood, rh negative     O-  . Urinary tract infection   . FH: multiple miscarriages or stillbirths     Past Surgical History  Procedure Date  . Abortions   . Wisdom tooth extraction     Family History  Problem Relation Age of Onset  . Diabetes Father   . Hypertension Father   . Cancer Maternal Grandmother     THROAT CA  . Hypertension Paternal Grandmother     History  Substance Use Topics  . Smoking status: Current Some Day Smoker -- 0.2 packs/day for 3 years    Types: Cigarettes  . Smokeless tobacco: Not on file  . Alcohol Use: No    OB History    Grav Para Term Preterm Abortions TAB SAB Ect Mult Living   5    4 1 3    0      Review of Systems  Constitutional: Negative for fever and chills.  Skin: Positive for wound.  All other  systems reviewed and are negative.    Allergies  Review of patient's allergies indicates no known allergies.  Home Medications   Current Outpatient Rx  Name  Route  Sig  Dispense  Refill  . CARVEDILOL 12.5 MG PO TABS   Oral   Take 12.5 mg by mouth 2 (two) times daily with a meal.         . TRIAMTERENE-HCTZ 37.5-25 MG PO TABS   Oral   Take 1 tablet by mouth daily.         Marland Kitchen PROMETHAZINE HCL 25 MG PO TABS   Oral   Take 0.5 tablets (12.5 mg total) by mouth every 6 (six) hours as needed for nausea.   20 tablet   0     BP 135/89  Pulse 90  Temp 98.8 F (37.1 C)  Resp 18  SpO2 100%  LMP 07/23/2012  Breastfeeding? Unknown  Physical Exam  Nursing note and vitals reviewed. Constitutional: She is oriented to person, place, and time. She appears well-developed and well-nourished. No distress.  HENT:  Head: Normocephalic and atraumatic.  Eyes: Conjunctivae normal and EOM are normal.  Neck: Neck supple. No tracheal deviation present.  Cardiovascular: Normal rate, regular rhythm  and normal heart sounds.   Pulmonary/Chest: Effort normal and breath sounds normal. No respiratory distress.  Musculoskeletal: Normal range of motion.  Neurological: She is alert and oriented to person, place, and time.  Skin: Skin is warm and dry.       1 cm in diameter, oval, fluctuant mass under the right axilla. Tenderness noted. No surrounding edema or erythema.   Psychiatric: She has a normal mood and affect. Her behavior is normal.    ED Course  Procedures (including critical care time) INCISION AND DRAINAGE Performed by: Suzanne Bullock Consent: Verbal consent obtained. Risks and benefits: risks, benefits and alternatives were discussed Type: abscess  Body area: right axilla  Anesthesia: local infiltration  Incision was made with a scalpel.  Local anesthetic: lidocaine 2% without epinephrine  Anesthetic total: 3 ml  Complexity: simple Blunt dissection to break up  loculations  Drainage: silver cyst sac  Drainage amount: small  Packing material: none  Patient tolerance: Patient tolerated the procedure well with no immediate complications.    DIAGNOSTIC STUDIES: Oxygen Saturation is 100% on room air, normal by my interpretation.    COORDINATION OF CARE:  22:20-Discussed planned course of treatment with the patient including an I&D, who is agreeable at this time.   22:28-Preformed I&D procedure.   Labs Reviewed - No data to display No results found.   1. Abscess, axilla       MDM  27 y/o female with abscess to right axilla. Abscess drained without problem. No antibiotics needed. She has appt with her PCP Saturday. Return precautions discussed.  I personally performed the services described in this documentation, which was scribed in my presence. The recorded information has been reviewed and is accurate.       Trevor Mace, PA-C 08/05/12 2249

## 2012-08-06 NOTE — ED Provider Notes (Signed)
Medical screening examination/treatment/procedure(s) were performed by non-physician practitioner and as supervising physician I was immediately available for consultation/collaboration.  Rudie Rikard R Kalynne Womac, MD 08/06/12 0037 

## 2012-12-08 ENCOUNTER — Encounter (HOSPITAL_COMMUNITY): Payer: Self-pay | Admitting: Emergency Medicine

## 2012-12-08 ENCOUNTER — Emergency Department (HOSPITAL_COMMUNITY)
Admission: EM | Admit: 2012-12-08 | Discharge: 2012-12-09 | Disposition: A | Discharge status: Home / Self Care | Payer: 59 | Attending: Emergency Medicine | Admitting: Emergency Medicine

## 2012-12-08 DIAGNOSIS — F17200 Nicotine dependence, unspecified, uncomplicated: Secondary | ICD-10-CM | POA: Insufficient documentation

## 2012-12-08 DIAGNOSIS — Z87440 Personal history of urinary (tract) infections: Secondary | ICD-10-CM | POA: Insufficient documentation

## 2012-12-08 DIAGNOSIS — L03319 Cellulitis of trunk, unspecified: Secondary | ICD-10-CM | POA: Insufficient documentation

## 2012-12-08 DIAGNOSIS — I10 Essential (primary) hypertension: Secondary | ICD-10-CM | POA: Insufficient documentation

## 2012-12-08 DIAGNOSIS — L0291 Cutaneous abscess, unspecified: Secondary | ICD-10-CM

## 2012-12-08 DIAGNOSIS — L02219 Cutaneous abscess of trunk, unspecified: Secondary | ICD-10-CM | POA: Insufficient documentation

## 2012-12-08 NOTE — ED Notes (Signed)
Pt states she has an abscess to her right groin area  Pt states it has been draining but it is still big and painful

## 2012-12-08 NOTE — ED Provider Notes (Signed)
History     CSN: [id number]  Arrival date & time 12/08/12  2301   First MD Initiated Contact with Patient 12/08/12 2339      Chief Complaint  Patient presents with  . Abscess    (Consider location/radiation/quality/duration/timing/severity/associated sxs/prior treatment) HPI Hx per PT - R groin abscess, x 2 days, getting larger and more painful, has h/o same requiring I/D. Symptoms MOD in severity, no F/C, no N/V, no difficulty urinating, no trauma. Sharp pain worse with palpation/ movement Past Medical History  Diagnosis Date  . Hypertension 08/2008    NO MEDS  . Type o blood, rh negative     O-  . Urinary tract infection   . FH: multiple miscarriages or stillbirths     Past Surgical History  Procedure Laterality Date  . Abortions    . Wisdom tooth extraction      Family History  Problem Relation Age of Onset  . Diabetes Father   . Hypertension Father   . Cancer Maternal Grandmother     THROAT CA  . Hypertension Paternal Grandmother     History  Substance Use Topics  . Smoking status: Current Some Day Smoker -- 0.25 packs/day for 3 years    Types: Cigarettes  . Smokeless tobacco: Not on file  . Alcohol Use: No    OB History   Grav Para Term Preterm Abortions TAB SAB Ect Mult Living   5    4 1 3    0      Review of Systems  Constitutional: Negative for fever and chills.  HENT: Negative for neck pain and neck stiffness.   Eyes: Negative for pain.  Respiratory: Negative for shortness of breath.   Cardiovascular: Negative for chest pain.  Gastrointestinal: Negative for vomiting and abdominal pain.  Genitourinary: Negative for dysuria.  Musculoskeletal: Negative for back pain.  Skin: Negative for rash.  Neurological: Negative for headaches.  All other systems reviewed and are negative.    Allergies  Review of patient's allergies indicates no known allergies.  Home Medications  No current outpatient prescriptions on file.  BP 139/88  Pulse 88   Temp(Src) 98.9 F (37.2 C) (Oral)  Resp 18  Ht 5\' 2"  (1.575 m)  Wt 160 lb (72.576 kg)  BMI 29.26 kg/m2  SpO2 99%  LMP 12/03/2012  Physical Exam  Nursing note and vitals reviewed. Constitutional: She is oriented to person, place, and time. She appears well-developed and well-nourished.  HENT:  Head: Normocephalic and atraumatic.  Eyes: EOM are normal. Pupils are equal, round, and reactive to light.  Neck: Neck supple.  Cardiovascular: Normal rate, normal heart sounds and intact distal pulses.   Pulmonary/Chest: Effort normal and breath sounds normal. No respiratory distress.  Abdominal: Soft. Bowel sounds are normal. She exhibits no distension. There is no tenderness. There is no rebound.  Genitourinary:  R groin abscess with fluctuance and TTP, not labial, mild erythema  Musculoskeletal: Normal range of motion. She exhibits no edema.  Neurological: She is alert and oriented to person, place, and time.  Skin: Skin is warm and dry.    ED Course  INCISION AND DRAINAGE Date/Time: 12/09/2012 12:00 AM Performed by: Sunnie Nielsen Authorized by: Sunnie Nielsen Consent: Verbal consent obtained. Risks and benefits: risks, benefits and alternatives were discussed Consent given by: patient Patient understanding: patient states understanding of the procedure being performed Patient consent: the patient's understanding of the procedure matches consent given Procedure consent: procedure consent matches procedure scheduled Required items: required blood  products, implants, devices, and special equipment available Patient identity confirmed: verbally with patient Time out: Immediately prior to procedure a "time out" was called to verify the correct patient, procedure, equipment, support staff and site/side marked as required. Type: abscess Location: R groin. Anesthesia: local infiltration Local anesthetic: lidocaine 1% with epinephrine Anesthetic total: 2 ml Risk factor: underlying major  vessel, underlying major nerve and coagulopathy Scalpel size: 11 Needle gauge: 22 Incision type: single straight Drainage: serosanguinous Drainage amount: scant Wound treatment: wound left open Patient tolerance: Patient tolerated the procedure well with no immediate complications.   (including critical care time)  Return precautions provided, 48 hour recheck, ABx prescribed   MDM  R groin abscess, recurrent with I/D, ABx - GYN referral provided  VS and nursing notes reviewed and considered      Sunnie Nielsen, MD 12/09/12 2303

## 2012-12-09 MED ORDER — LIDOCAINE-EPINEPHRINE 2 %-1:100000 IJ SOLN
20.0000 mL | Freq: Once | INTRAMUSCULAR | Status: AC
  Administered 2012-12-09: 20 mL via INTRADERMAL

## 2012-12-09 MED ORDER — IBUPROFEN 800 MG PO TABS: 800.0000 mg | ORAL_TABLET | Freq: Three times a day (TID) | ORAL | Status: DC

## 2012-12-09 MED ORDER — SULFAMETHOXAZOLE-TRIMETHOPRIM 800-160 MG PO TABS: 1.0000 | ORAL_TABLET | Freq: Two times a day (BID) | ORAL | Status: DC

## 2012-12-09 NOTE — ED Notes (Signed)
Dressing applied to wound prior to d/c.

## 2013-01-14 ENCOUNTER — Ambulatory Visit (INDEPENDENT_AMBULATORY_CARE_PROVIDER_SITE_OTHER): Payer: 59 | Admitting: Women's Health

## 2013-01-14 ENCOUNTER — Encounter: Payer: Self-pay | Admitting: Women's Health

## 2013-01-14 ENCOUNTER — Other Ambulatory Visit (HOSPITAL_COMMUNITY)
Admission: RE | Admit: 2013-01-14 | Discharge: 2013-01-14 | Disposition: A | Discharge status: Home / Self Care | Payer: 59 | Source: Ambulatory Visit | Attending: Gynecology | Admitting: Gynecology

## 2013-01-14 VITALS — BP 124/84 | Ht 64.5 in | Wt 175.0 lb

## 2013-01-14 DIAGNOSIS — O039 Complete or unspecified spontaneous abortion without complication: Secondary | ICD-10-CM

## 2013-01-14 DIAGNOSIS — Z833 Family history of diabetes mellitus: Secondary | ICD-10-CM

## 2013-01-14 DIAGNOSIS — N926 Irregular menstruation, unspecified: Secondary | ICD-10-CM

## 2013-01-14 DIAGNOSIS — N96 Recurrent pregnancy loss: Secondary | ICD-10-CM | POA: Insufficient documentation

## 2013-01-14 DIAGNOSIS — Z01419 Encounter for gynecological examination (general) (routine) without abnormal findings: Secondary | ICD-10-CM

## 2013-01-14 DIAGNOSIS — Z113 Encounter for screening for infections with a predominantly sexual mode of transmission: Secondary | ICD-10-CM

## 2013-01-14 NOTE — Progress Notes (Signed)
Suzanne Bullock June 10, 1986 [id number]    History:    The patient presents for annual exam.  Cycles every 26-28 days for 5 days. History of 4 SABs, 1 TAB. O- blood type. At least 2 pregnancies appear to stop growing. Gardasil series completed 2008. Has had elevated blood pressures in the past on no medication. Smokes 2-5 cigarettes per day. Same partner 2 years negative GC/Chlamydia. 2009 LGSIL with a negative biopsy, ascus with negative HR HPV 2010, normal Paps after.   Past medical history, past surgical history, family history and social history were all reviewed and documented in the EPIC chart. Works for [employer]. Father diabetes and hypertension.   ROS:  A  ROS was performed and pertinent positives and negatives are included in the history.  Exam:  Filed Vitals:   01/14/13 0839  BP: 124/84    General appearance:  Normal Head/Neck:  Normal, without cervical or supraclavicular adenopathy. Thyroid:  Symmetrical, normal in size, without palpable masses or nodularity. Respiratory  Effort:  Normal  Auscultation:  Clear without wheezing or rhonchi Cardiovascular  Auscultation:  Regular rate, without rubs, murmurs or gallops  Edema/varicosities:  Not grossly evident Abdominal  Soft,nontender, without masses, guarding or rebound.  Liver/spleen:  No organomegaly noted  Hernia:  None appreciated  Skin  Inspection:  Grossly normal  Palpation:  Grossly normal Neurologic/psychiatric  Orientation:  Normal with appropriate conversation.  Mood/affect:  Normal  Genitourinary    Breasts: Examined lying and sitting.     Right: Without masses, retractions, discharge or axillary adenopathy.     Left: Without masses, retractions, discharge or axillary adenopathy.   Inguinal/mons:  Normal without inguinal adenopathy  External genitalia:  Normal  BUS/Urethra/Skene's glands:  Normal  Bladder:  Normal  Vagina:  Normal  Cervix:  Normal  Uterus:   normal in size, shape and contour.   Midline and mobile  Adnexa/parametria:     Rt: Without masses or tenderness.   Lt: Without masses or tenderness.  Anus and perineum: Normal  Digital rectal exam: Normal sphincter tone without palpated masses or tenderness  Assessment/Plan:  27 y.o. SBF G5P0 for annual exam with recurrent pregnancy loss first trimester.     Plan: CBC, TSH, prolactin, glucose, ANA., UA, Pap, HIV, hep B, C., RPR and HSV IgG. Progesterone on day 22-25 next cycle. Reviewed importance of no smoking for health. Reviewed if all labs are normal we'll proceed to a sonohysterogram to check for uterine abnormality. Continue SBE's, regular exercise, calcium rich diet, prenatal vitamin daily. Decrease calories for weight loss, continue to monitor blood pressure.    [facility], 11:58 AM 01/14/2013

## 2013-01-14 NOTE — Patient Instructions (Addendum)

## 2013-01-15 LAB — URINALYSIS W MICROSCOPIC + REFLEX CULTURE
Bacteria, UA: NONE SEEN
Bilirubin Urine: NEGATIVE
Casts: NONE SEEN
Crystals: NONE SEEN
Glucose, UA: NEGATIVE mg/dL
Hgb urine dipstick: NEGATIVE
Ketones, ur: NEGATIVE mg/dL
Leukocytes, UA: NEGATIVE
Nitrite: NEGATIVE
Protein, ur: NEGATIVE mg/dL
Specific Gravity, Urine: 1.023 (ref 1.005–1.030)
Urobilinogen, UA: 0.2 mg/dL (ref 0.0–1.0)
pH: 6 (ref 5.0–8.0)

## 2013-01-20 ENCOUNTER — Other Ambulatory Visit: Payer: 59

## 2013-01-20 DIAGNOSIS — O039 Complete or unspecified spontaneous abortion without complication: Secondary | ICD-10-CM

## 2013-01-20 LAB — RPR

## 2013-01-20 LAB — CBC WITH DIFFERENTIAL/PLATELET
Basophils Absolute: 0 10*3/uL (ref 0.0–0.1)
Basophils Relative: 0 % (ref 0–1)
Eosinophils Absolute: 0.2 10*3/uL (ref 0.0–0.7)
Eosinophils Relative: 2 % (ref 0–5)
HCT: 39.3 % (ref 36.0–46.0)
Hemoglobin: 13.1 g/dL (ref 12.0–15.0)
Lymphocytes Relative: 30 % (ref 12–46)
Lymphs Abs: 2.7 10*3/uL (ref 0.7–4.0)
MCH: 27.7 pg (ref 26.0–34.0)
MCHC: 33.3 g/dL (ref 30.0–36.0)
MCV: 83.1 fL (ref 78.0–100.0)
Monocytes Absolute: 0.6 10*3/uL (ref 0.1–1.0)
Monocytes Relative: 7 % (ref 3–12)
Neutro Abs: 5.5 10*3/uL (ref 1.7–7.7)
Neutrophils Relative %: 61 % (ref 43–77)
Platelets: 326 10*3/uL (ref 150–400)
RBC: 4.73 MIL/uL (ref 3.87–5.11)
RDW: 15.3 % (ref 11.5–15.5)
WBC: 9.1 10*3/uL (ref 4.0–10.5)

## 2013-01-20 LAB — PROLACTIN: Prolactin: 12.9 ng/mL

## 2013-01-20 LAB — HEPATITIS C ANTIBODY: HCV Ab: NEGATIVE

## 2013-01-20 LAB — TSH: TSH: 0.819 u[IU]/mL (ref 0.350–4.500)

## 2013-01-20 LAB — HEPATITIS B SURFACE ANTIGEN: Hepatitis B Surface Ag: NEGATIVE

## 2013-01-20 LAB — GLUCOSE, RANDOM: Glucose, Bld: 87 mg/dL (ref 70–99)

## 2013-01-20 LAB — HIV ANTIBODY (ROUTINE TESTING W REFLEX): HIV: NONREACTIVE

## 2013-01-20 LAB — PROGESTERONE: Progesterone: 5.2 ng/mL

## 2013-01-21 LAB — ANA: Anti Nuclear Antibody(ANA): NEGATIVE

## 2013-01-21 LAB — HSV(HERPES SIMPLEX VRS) I + II AB-IGG
HSV 1 Glycoprotein G Ab, IgG: 8.12 IV — ABNORMAL HIGH
HSV 2 Glycoprotein G Ab, IgG: 11.16 IV — ABNORMAL HIGH

## 2013-01-28 ENCOUNTER — Encounter: Payer: Self-pay | Admitting: Gynecology

## 2013-01-28 ENCOUNTER — Ambulatory Visit (INDEPENDENT_AMBULATORY_CARE_PROVIDER_SITE_OTHER): Payer: 59 | Admitting: Gynecology

## 2013-01-28 DIAGNOSIS — N96 Recurrent pregnancy loss: Secondary | ICD-10-CM

## 2013-01-28 LAB — PROGESTERONE: Progesterone: 5 ng/mL

## 2013-01-28 NOTE — Patient Instructions (Addendum)
Follow up for ultrasound as scheduled 

## 2013-01-28 NOTE — Progress Notes (Signed)
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 History is as follows:  Age weeks pregnant 17 11 21  7 24   elective TAB 25 12 28 10   Patient notes same partner for last 2 different partner for others. Not being followed for any medical issues. Regular monthly menses at 28 days. No intermenstrual  spotting or bleeding.  Exam with Selena Batten assistant HEENT thyroid normal  Abdomen soft nontender without masses guarding rebound organomegaly. Pelvic external BUS vagina normal. Cervix normal with appropriate length. Uterus normal size mobile nontender. Adnexa without masses or tenderness  Assessment and plan: Habitual abortion. Harriett Sine had checked TSH prolactin and ANA which were normal. Progesterone was 5.2. I reviewed the differential for habitual abortion to include anatomic, chromosomal, antibody, luteal phase deficiency. We'll check sonohysterogram, peripheral chromosome analysis, lupus anticoagulant, anticardiolipin antibodies, and progesterone level today. Patient is at the very end of her cycle anticipating menses now. Discussed whether we should check her partner for peripheral chromosome analysis. As her miscarriages were with different partners feel probably not necessary. She is Rh- and does report receiving RhoGAM after all of her pregnancy episodes. Patient will followup for her lab results and sonohysterogram. Discussed possible luteal support with progesterone

## 2013-01-31 LAB — CARDIOLIPIN ANTIBODIES, IGG, IGM, IGA
Anticardiolipin IgA: 10 APL U/mL (ref <22)
Anticardiolipin IgG: 8 GPL U/mL (ref <23)
Anticardiolipin IgM: 14 MPL U/mL — ABNORMAL HIGH (ref <11)

## 2013-02-01 LAB — LUPUS ANTICOAGULANT PANEL
DRVVT: 38.2 secs (ref <42.9)
Lupus Anticoagulant: NOT DETECTED
PTT Lupus Anticoagulant: 41.6 secs (ref 28.0–43.0)

## 2013-02-10 LAB — CHROMOSOME ANALYSIS, PERIPHERAL BLOOD

## 2013-08-22 ENCOUNTER — Emergency Department (HOSPITAL_COMMUNITY)
Admission: EM | Admit: 2013-08-22 | Discharge: 2013-08-23 | Discharge status: Against Medical Advice | Payer: 59 | Attending: Emergency Medicine | Admitting: Emergency Medicine

## 2013-08-22 ENCOUNTER — Encounter (HOSPITAL_COMMUNITY): Payer: Self-pay | Admitting: Emergency Medicine

## 2013-08-22 DIAGNOSIS — Z8742 Personal history of other diseases of the female genital tract: Secondary | ICD-10-CM | POA: Insufficient documentation

## 2013-08-22 DIAGNOSIS — F17200 Nicotine dependence, unspecified, uncomplicated: Secondary | ICD-10-CM | POA: Insufficient documentation

## 2013-08-22 DIAGNOSIS — R55 Syncope and collapse: Secondary | ICD-10-CM | POA: Insufficient documentation

## 2013-08-22 DIAGNOSIS — Z3202 Encounter for pregnancy test, result negative: Secondary | ICD-10-CM | POA: Insufficient documentation

## 2013-08-22 DIAGNOSIS — I10 Essential (primary) hypertension: Secondary | ICD-10-CM | POA: Insufficient documentation

## 2013-08-22 DIAGNOSIS — Z79899 Other long term (current) drug therapy: Secondary | ICD-10-CM | POA: Insufficient documentation

## 2013-08-22 DIAGNOSIS — Z87440 Personal history of urinary (tract) infections: Secondary | ICD-10-CM | POA: Insufficient documentation

## 2013-08-22 MED ORDER — SODIUM CHLORIDE 0.9 % IV BOLUS (SEPSIS): 1000.0000 mL | Freq: Once | INTRAVENOUS | Status: DC

## 2013-08-22 NOTE — ED Notes (Signed)
Pt states she has not eaten today until they went to [place] tonight  Pt states they had just finished eating when she started feeling hot and light headed   Pt admits to smoking some marijuana earlier today

## 2013-08-22 NOTE — ED Provider Notes (Signed)
CSN: [id number]     Arrival date & time 08/22/13  2258 History   First MD Initiated Contact with Patient 08/22/13 2341     Chief Complaint  Patient presents with  . Loss of Consciousness   HPI  History provided by the patient. Patient is a 28 year old female with history of hypertension presents with episode of syncope. Patient was with friends who were just finishing eating at a local restaurant walking outside. Patient states that she suddenly started to feel lightheaded and flushed and warm in the head and face. She tried to sit down but then reports having brief loss of consciousness. She awoke with her boyfriend shaking her and calling her name. Episode lasted only a few seconds. She was feeling a little bit improved but still slightly lightheaded and went to the car to sit and rest. She was feeling much better as they drove to the emergency department. However patient had some return of her lightheadedness after getting out of car and standing and walking. She denies any alcohol use at dinner. No other drug use. She has been feeling well otherwise recently. No recent illness, fever, chills or sweats. Denies any associated chest pain, palpitations or shortness of breath. Denies any headache, confusion or speech change. No shaking or tremors. No urinary or fecal incontinence.    Past Medical History  Diagnosis Date  . Hypertension 08/2008    NO MEDS  . Type o blood, rh negative     O-  . Urinary tract infection   . FH: multiple miscarriages or stillbirths    Past Surgical History  Procedure Laterality Date  . Abortions    . Wisdom tooth extraction     Family History  Problem Relation Age of Onset  . Diabetes Father   . Hypertension Father   . Cancer Maternal Grandmother     THROAT CA  . Hypertension Paternal Grandmother    History  Substance Use Topics  . Smoking status: Current Some Day Smoker -- 0.25 packs/day for 3 years    Types: Cigarettes  . Smokeless tobacco: Not on  file  . Alcohol Use: No   OB History   Grav Para Term Preterm Abortions TAB SAB Ect Mult Living   5    5 1 4    0     Review of Systems  Respiratory: Negative for shortness of breath.   Cardiovascular: Negative for chest pain and palpitations.  Neurological: Positive for light-headedness. Negative for headaches.  All other systems reviewed and are negative.    Allergies  Review of patient's allergies indicates no known allergies.  Home Medications   Current Outpatient Rx  Name  Route  Sig  Dispense  Refill  . hydrochlorothiazide (MICROZIDE) 12.5 MG capsule   Oral   Take 12.5 mg by mouth every morning.         . Ibuprofen-Diphenhydramine Cit (ADVIL PM PO)   Oral   Take 2 capsules by mouth at bedtime as needed (pain/sleep).          BP 90/46  Pulse 79  Temp(Src) 97.5 F (36.4 C) (Oral)  SpO2 99%  LMP 08/21/2013 Physical Exam  Nursing note and vitals reviewed. Constitutional: She is oriented to person, place, and time. She appears well-developed and well-nourished. No distress.  HENT:  Head: Normocephalic and atraumatic.  Eyes: Conjunctivae and EOM are normal. Pupils are equal, round, and reactive to light.  Neck: Normal range of motion. Neck supple.  Cardiovascular: Normal rate and regular rhythm.  No murmur heard. Pulmonary/Chest: Effort normal and breath sounds normal. No respiratory distress. She has no wheezes. She has no rales.  Abdominal: Soft. There is no tenderness. There is no rigidity, no rebound, no guarding, no CVA tenderness and no tenderness at McBurney's point.  Musculoskeletal: Normal range of motion. She exhibits no edema and no tenderness.  Neurological: She is alert and oriented to person, place, and time. She has normal strength. No cranial nerve deficit or sensory deficit. Gait normal.  Skin: Skin is warm and dry. No rash noted.  Psychiatric: She has a normal mood and affect. Her behavior is normal.    ED Course  Procedures   DIAGNOSTIC  STUDIES: Oxygen Saturation is 99% on room air.  COORDINATION OF CARE:  Nursing notes reviewed. Vital signs reviewed. Initial pt interview and examination performed.   11:45 PM-patient seen and evaluated. Patient well-appearing she has no symptoms or complaints at this time. Discussed work up plan with pt at bedside, which includes lab testing. Pt agrees with plan.  Patient does have significant drop of systolic blood pressure with standing. We'll hydrate with IV fluids.  Patient was initially agreeable to plan of IV fluids with IV and blood testing. She has since changed her mind and does not wish to have any further testing or treatments. She is requesting to return home. I discussed the possibilities of concerning causes of syncope such as severe anemia or concerning electrolyte abnormalities. Patient still did not wish to have any further testing. She expressed her understanding of the possible risks of not having testing. We discussed strict return precautions.    Treatment plan initiated: Medications  sodium chloride 0.9 % bolus 1,000 mL (not administered)     EKG Interpretation   None      Date: 08/22/2013  Rate: 70  Rhythm: normal sinus rhythm  QRS Axis: normal  Intervals: normal  ST/T Wave abnormalities: nonspecific ST changes  Conduction Disutrbances:none  Narrative Interpretation: Slight ST changes likely repolarization abnormality  Old EKG Reviewed: none available    MDM   1. Syncope        Martie Lee, PA-C 08/23/13 [phone]

## 2013-08-22 NOTE — ED Notes (Signed)
Pt states she was eating at [employer] and states she started feeling light headed and got really hot  Pt went outside and sat down on the curb  Boyfriend states he tried to get her up to go to the car and she passed out  Pt states when she got here and was at the front desk she started feeling the same way and almost passed out again  Pt states she feels better now

## 2013-08-23 LAB — POCT PREGNANCY, URINE: Preg Test, Ur: NEGATIVE

## 2013-08-23 NOTE — Discharge Instructions (Signed)
You were seen and evaluated for your episode of loss of consciousness and syncope. Your providers strongly recommended evaluation of your episode with blood testing and treatment of your low blood pressure with IV fluids. You have refused any further testing or treatments in the emergency department. You may return at anytime if you change your mind or wish to have further evaluation and treatment. Additionally encouraged he return if you have any additional symptoms. Please followup with your primary care provider as well.    Syncope Syncope is a fainting spell. This means the person loses consciousness and drops to the ground. The person is generally unconscious for less than 5 minutes. The person may have some muscle twitches for up to 15 seconds before waking up and returning to normal. Syncope occurs more often in elderly people, but it can happen to anyone. While most causes of syncope are not dangerous, syncope can be a sign of a serious medical problem. It is important to seek medical care.  CAUSES  Syncope is caused by a sudden decrease in blood flow to the brain. The specific cause is often not determined. Factors that can trigger syncope include:  Taking medicines that lower blood pressure.  Sudden changes in posture, such as standing up suddenly.  Taking more medicine than prescribed.  Standing in one place for too long.  Seizure disorders.  Dehydration and excessive exposure to heat.  Low blood sugar (hypoglycemia).  Straining to have a bowel movement.  Heart disease, irregular heartbeat, or other circulatory problems.  Fear, emotional distress, seeing blood, or severe pain. SYMPTOMS  Right before fainting, you may:  Feel dizzy or lightheaded.  Feel nauseous.  See all white or all black in your field of vision.  Have cold, clammy skin. DIAGNOSIS  Your caregiver will ask about your symptoms, perform a physical exam, and perform electrocardiography (ECG) to record  the electrical activity of your heart. Your caregiver may also perform other heart or blood tests to determine the cause of your syncope. TREATMENT  In most cases, no treatment is needed. Depending on the cause of your syncope, your caregiver may recommend changing or stopping some of your medicines. HOME CARE INSTRUCTIONS  Have someone stay with you until you feel stable.  Do not drive, operate machinery, or play sports until your caregiver says it is okay.  Keep all follow-up appointments as directed by your caregiver.  Lie down right away if you start feeling like you might faint. Breathe deeply and steadily. Wait until all the symptoms have passed.  Drink enough fluids to keep your urine clear or pale yellow.  If you are taking blood pressure or heart medicine, get up slowly, taking several minutes to sit and then stand. This can reduce dizziness. SEEK IMMEDIATE MEDICAL CARE IF:   You have a severe headache.  You have unusual pain in the chest, abdomen, or back.  You are bleeding from the mouth or rectum, or you have black or tarry stool.  You have an irregular or very fast heartbeat.  You have pain with breathing.  You have repeated fainting or seizure-like jerking during an episode.  You faint when sitting or lying down.  You have confusion.  You have difficulty walking.  You have severe weakness.  You have vision problems. If you fainted, call your local emergency services (911 in [place] Do not drive yourself to the hospital.  MAKE SURE YOU:  Understand these instructions.  Will watch your condition.  Will get help  right away if you are not doing well or get worse. Document Released: 07/15/2005 Document Revised: 01/14/2012 Document Reviewed: 09/13/2011 [facility] Patient Information 2014 [place].

## 2013-08-23 NOTE — ED Notes (Signed)
Pt is refusing IV.

## 2013-08-23 NOTE — ED Provider Notes (Signed)
Medical screening examination/treatment/procedure(s) were performed by non-physician practitioner and as supervising physician I was immediately available for consultation/collaboration.  Davie Claud, MD 08/23/13 0335 

## 2013-10-06 ENCOUNTER — Encounter: Payer: Self-pay | Admitting: Women's Health

## 2013-10-06 ENCOUNTER — Ambulatory Visit (INDEPENDENT_AMBULATORY_CARE_PROVIDER_SITE_OTHER): Payer: 59 | Admitting: Women's Health

## 2013-10-06 DIAGNOSIS — N760 Acute vaginitis: Secondary | ICD-10-CM

## 2013-10-06 DIAGNOSIS — N898 Other specified noninflammatory disorders of vagina: Secondary | ICD-10-CM

## 2013-10-06 DIAGNOSIS — A499 Bacterial infection, unspecified: Secondary | ICD-10-CM

## 2013-10-06 DIAGNOSIS — B9689 Other specified bacterial agents as the cause of diseases classified elsewhere: Secondary | ICD-10-CM

## 2013-10-06 DIAGNOSIS — R350 Frequency of micturition: Secondary | ICD-10-CM

## 2013-10-06 LAB — URINALYSIS W MICROSCOPIC + REFLEX CULTURE
Bilirubin Urine: NEGATIVE
Glucose, UA: NEGATIVE mg/dL
Hgb urine dipstick: NEGATIVE
Ketones, ur: NEGATIVE mg/dL
Leukocytes, UA: NEGATIVE
Nitrite: NEGATIVE
Protein, ur: NEGATIVE mg/dL
Specific Gravity, Urine: 1.02 (ref 1.005–1.030)
Urobilinogen, UA: 0.2 mg/dL (ref 0.0–1.0)
pH: 7 (ref 5.0–8.0)

## 2013-10-06 LAB — WET PREP FOR TRICH, YEAST, CLUE
Trich, Wet Prep: NONE SEEN
Yeast Wet Prep HPF POC: NONE SEEN

## 2013-10-06 MED ORDER — METRONIDAZOLE 500 MG PO TABS: 500.0000 mg | ORAL_TABLET | Freq: Two times a day (BID) | ORAL | Status: DC

## 2013-10-06 MED ORDER — FLUCONAZOLE 150 MG PO TABS: 150.0000 mg | ORAL_TABLET | Freq: Once | ORAL | Status: DC

## 2013-10-06 NOTE — Progress Notes (Signed)
Patient ID: Suzanne Bullock, female   DOB: 13-Jan-1986, 28 y.o.   MRN: [id number] Presents with urinary frequency and increased vaginal discharge for several days. Same partner. Monthly cycle. Currently on amoxicillin for strep throat. Denies abdominal pain or fever. Some low back discomfort.  Exam: Appears well. UA: Negative. No CVAT External genitalia within normal limits, speculum exam moderate amount of a white creamy discharge noted wet prep positive for amines, clues, TNTC bacteria. Bimanual no CMT or adnexal fullness or tenderness.  Bacteria vaginosis  Plan: Flagyl 500 twice daily for 7 days, alcohol precautions reviewed. Diflucan 150 by mouth times one dose. Instructed to call if no relief of discharge.

## 2013-10-06 NOTE — Patient Instructions (Signed)
Bacterial Vaginosis Bacterial vaginosis is an infection of the vagina. It happens when too many of certain germs (bacteria) grow in the vagina. HOME CARE  Take your medicine as told by your doctor.  Finish your medicine even if you start to feel better.  Do not have sex until you finish your medicine and are better.  Tell your sex partner that you have an infection. They should see their doctor for treatment.  Practice safe sex. Use condoms. Have only one sex partner. GET HELP IF:  You are not getting better after 3 days of treatment.  You have more grey fluid (discharge) coming from your vagina than before.  You have more pain than before.  You have a fever. MAKE SURE YOU:   Understand these instructions.  Will watch your condition.  Will get help right away if you are not doing well or get worse. Document Released: 04/23/2008 Document Revised: 05/05/2013 Document Reviewed: 02/24/2013 [employer] Patient Information 2014 [employer].  

## 2014-04-07 ENCOUNTER — Telehealth: Payer: Self-pay

## 2014-04-07 NOTE — Telephone Encounter (Signed)
Number does not allow messages, and no answer

## 2014-04-07 NOTE — Telephone Encounter (Signed)
Patient called in voice mail stating she needed a new prescription. She said [place] had prescribed before but did not put refills. She said to call her and she would tell me what the medication was.  I called hear back and went to voice mail.  I left message and told her upon review of her chart she is 3 months overdue for her annual and really needs to schedule appointment and to call me back.

## 2014-04-07 NOTE — Telephone Encounter (Signed)
Patient called back and spoke with [facility]. She scheduled her CE for Oct and a RG visit for tomorrow to check problem she needs Rx for.

## 2014-04-07 NOTE — Telephone Encounter (Signed)
Patient overdue for CE by 3 mos.  She scheduled appt in Oct. She c/o bacterial infection said she is familiar with symptoms. Suzanne Bullock scheduled her appt for tomorrow to see you.  She called back saying work schedule making it difficult to come in and she wondered if you would prescribe for her like you did back in March.

## 2014-04-08 ENCOUNTER — Other Ambulatory Visit: Payer: Self-pay | Admitting: Women's Health

## 2014-04-08 ENCOUNTER — Ambulatory Visit: Payer: 59 | Admitting: Women's Health

## 2014-04-08 DIAGNOSIS — N760 Acute vaginitis: Secondary | ICD-10-CM

## 2014-04-08 DIAGNOSIS — B9689 Other specified bacterial agents as the cause of diseases classified elsewhere: Secondary | ICD-10-CM

## 2014-04-08 MED ORDER — METRONIDAZOLE 500 MG PO TABS: 500.0000 mg | ORAL_TABLET | Freq: Two times a day (BID) | ORAL | Status: DC

## 2014-04-08 NOTE — Telephone Encounter (Signed)
Number does not accept incoming calls

## 2014-04-14 NOTE — Telephone Encounter (Signed)
Cell number does not except calls

## 2014-05-05 ENCOUNTER — Encounter: Payer: 59 | Admitting: Women's Health

## 2014-05-30 ENCOUNTER — Encounter: Payer: Self-pay | Admitting: Women's Health

## 2014-08-06 ENCOUNTER — Emergency Department (HOSPITAL_COMMUNITY)
Admission: EM | Admit: 2014-08-06 | Discharge: 2014-08-06 | Disposition: A | Discharge status: Home / Self Care | Payer: 59 | Source: Home / Self Care | Attending: Emergency Medicine | Admitting: Emergency Medicine

## 2014-08-06 ENCOUNTER — Encounter (HOSPITAL_COMMUNITY): Payer: Self-pay | Admitting: Emergency Medicine

## 2014-08-06 DIAGNOSIS — D690 Allergic purpura: Secondary | ICD-10-CM

## 2014-08-06 LAB — POCT URINALYSIS DIP (DEVICE)
Glucose, UA: NEGATIVE mg/dL
Hgb urine dipstick: NEGATIVE
Leukocytes, UA: NEGATIVE
Nitrite: NEGATIVE
Protein, ur: 100 mg/dL — AB
Specific Gravity, Urine: >=1.03 (ref 1.005–1.030)
Urobilinogen, UA: 0.2 mg/dL (ref 0.0–1.0)
pH: 6 (ref 5.0–8.0)

## 2014-08-06 LAB — POCT I-STAT, CHEM 8
BUN: 8 mg/dL (ref 6–23)
Calcium, Ion: 1.16 mmol/L (ref 1.12–1.23)
Chloride: 105 mEq/L (ref 96–112)
Creatinine, Ser: 0.7 mg/dL (ref 0.50–1.10)
Glucose, Bld: 92 mg/dL (ref 70–99)
HCT: 49 % — ABNORMAL HIGH (ref 36.0–46.0)
Hemoglobin: 16.7 g/dL — ABNORMAL HIGH (ref 12.0–15.0)
Potassium: 4 mmol/L (ref 3.5–5.1)
Sodium: 137 mmol/L (ref 135–145)
TCO2: 17 mmol/L (ref 0–100)

## 2014-08-06 LAB — CBC WITH DIFFERENTIAL/PLATELET
Basophils Absolute: 0.1 10*3/uL (ref 0.0–0.1)
Basophils Relative: 1 % (ref 0–1)
Eosinophils Absolute: 0 10*3/uL (ref 0.0–0.7)
Eosinophils Relative: 0 % (ref 0–5)
HCT: 42.1 % (ref 36.0–46.0)
Hemoglobin: 14.2 g/dL (ref 12.0–15.0)
Lymphocytes Relative: 37 % (ref 12–46)
Lymphs Abs: 2.9 10*3/uL (ref 0.7–4.0)
MCH: 28.5 pg (ref 26.0–34.0)
MCHC: 33.7 g/dL (ref 30.0–36.0)
MCV: 84.5 fL (ref 78.0–100.0)
Monocytes Absolute: 1 10*3/uL (ref 0.1–1.0)
Monocytes Relative: 13 % — ABNORMAL HIGH (ref 3–12)
Neutro Abs: 3.9 10*3/uL (ref 1.7–7.7)
Neutrophils Relative %: 49 % (ref 43–77)
Platelets: 330 10*3/uL (ref 150–400)
RBC: 4.98 MIL/uL (ref 3.87–5.11)
RDW: 14.6 % (ref 11.5–15.5)
WBC: 7.9 10*3/uL (ref 4.0–10.5)

## 2014-08-06 LAB — POCT PREGNANCY, URINE: Preg Test, Ur: NEGATIVE

## 2014-08-06 MED ORDER — HYDROCODONE-ACETAMINOPHEN 5-325 MG PO TABS: ORAL_TABLET | ORAL | Status: DC

## 2014-08-06 MED ORDER — MELOXICAM 15 MG PO TABS: 15.0000 mg | ORAL_TABLET | Freq: Every day | ORAL | Status: DC

## 2014-08-06 NOTE — Discharge Instructions (Signed)
Henoch-Schonlein Purpura This is a soreness (inflammation) of the blood vessels and/or capillaries. It is seen as red to purple spots on the skin. These can usually be felt as a raised rash. It usually occurs in the fall, winter, and spring but rarely in the summer. There may also be joint pain (around the knees and ankles), belly (abdominal) pain, and kidney problems. It usually occurs in children ages 47 to 75, but adults may also be affected. It is more common in boys. The rash will usually show up on the legs and buttocks. This happens before other problems such as abdominal pain and arthritis (inflammation of the joints). The rash can spread to the face and body (trunk). CAUSES  The cause is not known. It may be an abnormal response by the immune system. That is the system which usually keeps [place] from getting sick. It is sometimes seen with or following allergies, drug sensitivities, vaccinations, and infections.  SYMPTOMS   Primarily, redness and swelling of the skin caused by congestion of the capillaries.  Lack of energy.  Joint pains.  Abdominal pain.  Bloody stools.  Purple spots (wheals) - usually on the lower extremities and buttocks, but may involve elbows, trunk, and face.  Low-grade fever.  Nausea/vomiting.  Diarrhea.  Bloody urine. DIAGNOSIS   Your caregiver can diagnose this condition based on an examination. He or she also may do some blood and urine tests.  Biopsies are sometimes done. In this test, a small piece of tissue (your skin) is taken to be examined by a specialist under a microscope. This is used to help confirm a diagnosis if there is uncertainty. TREATMENT   Treatment is directed at symptoms or, along with your caregiver, you may just watch and see. There are not any specific treatments available.  This condition usually resolves spontaneously within 6 to 16 weeks without treatment. But it may recur several times before complete remission.  Bed  rest.  Drink plenty of fluids as suggested by your caregiver to avoid dehydration.  Nonsteroidal anti-inflammatory drugs (NSAIDs) or acetaminophen maybe used for aches and fever as directed by your caregiver. Only take over-the-counter or prescription medicines for pain, discomfort, or fever as directed by your caregiver.  Corticosteroid therapy may be used for the central nervous system problems, kidney (nephritic) syndrome, or complications, such as intestinal hemorrhage, obstruction, or perforation.  Medications are available to treat kidney complications.  Most children get well. But some children can develop kidney failure (end stage renal disease). COMPLICATIONS  Bleeding in the lungs (pulmonary hemorrhage).  Bleeding into the intestines (intestinal hemorrhage).  Food is blocked from going through the intestines (intestinal obstruction).  There is a telescoping of the small intestine (bowel) on itself (intussusception).  A hole in the intestines (intestinal perforation).  Death can occur from gastrointestinal complications, renal failure, or central nervous system involvement. These bad outcomes are very rare. SEEK IMMEDIATE MEDICAL CARE IF:   There is severe abdominal pain or nausea and vomiting.  There is severe headache or joint pain not relieved with medicine.  There is blood in the urine or urination stops.  There is increasing swelling and pain in the joints.  Your child feels light-headed or faint. Document Released: 06/11/2004 Document Revised: 11/29/2013 Document Reviewed: 12/23/2008 [facility] Patient Information 2015 [place], [place]. This information is not intended to replace advice given to you by your health care provider. Make sure you discuss any questions you have with your health care provider.

## 2014-08-06 NOTE — ED Notes (Signed)
Pt states that her legs have been hurting and painful. Pt states that rash appeared last night

## 2014-08-06 NOTE — ED Provider Notes (Signed)
Chief Complaint    Rash and Leg Pain   History of Present Illness      Suzanne Bullock is a 29 year old female who has had a two-day history of painful, red bumps on her feet, pretibial surfaces, and lower legs, aching in her knees, she's felt chilled, had low-grade fever, sweats, malaise, and fatigue. She tried Advil without improvement. She denies any sore throat, nasal congestion, rhinorrhea, cough, abdominal pain, nausea, vomiting, diarrhea, or blood in the stool. She denies any urinary symptoms. She does have a history of proteinuria of unknown cause.  Review of Systems   Other than as noted above, the patient denies any of the following symptoms: Systemic:  No fever or chills. ENT:  No nasal congestion, rhinorrhea, sore throat, swelling of lips, tongue or throat. Resp:  No cough, wheezing, or shortness of breath.  [place]    Past medical history, family history, social history, meds, and allergies were reviewed.   Physical Exam     Vital signs:  BP 144/99 mmHg  Pulse 99  Temp(Src) 100.1 F (37.8 C) (Oral)  Resp 16  SpO2 99%  LMP 07/09/2014 Gen:  Alert, oriented, in no distress. ENT:  Pharynx clear, no intraoral lesions, moist mucous membranes. Lungs:  Clear to auscultation. Heart: Regular rhythm, no gallops or murmurs. Abdomen: Soft, nontender, no organomegaly or mass. Bowel sounds normal. Extremities: No edema, has slight swelling and pain to palpation over both knees. No ankle pain to palpation, slight pretibial pain to palpation. Skin:  Raised, erythematous maculopapules on her feet, ankles, and lower legs, mildly tender to touch. No purpuric lesions.   Labs   Results for orders placed or performed during the hospital encounter of 08/06/14  CBC with Differential  Result Value Ref Range   WBC 7.9 4.0 - 10.5 K/uL   RBC 4.98 3.87 - 5.11 MIL/uL   Hemoglobin 14.2 12.0 - 15.0 g/dL   HCT 42.1 36.0 - 46.0 %   MCV 84.5 78.0 - 100.0 fL   MCH 28.5 26.0 - 34.0 pg   MCHC  33.7 30.0 - 36.0 g/dL   RDW 14.6 11.5 - 15.5 %   Platelets 330 150 - 400 K/uL   Neutrophils Relative % PENDING 43 - 77 %   Neutro Abs PENDING 1.7 - 7.7 K/uL   Band Neutrophils PENDING 0 - 10 %   Lymphocytes Relative PENDING 12 - 46 %   Lymphs Abs PENDING 0.7 - 4.0 K/uL   Monocytes Relative PENDING 3 - 12 %   Monocytes Absolute PENDING 0.1 - 1.0 K/uL   Eosinophils Relative PENDING 0 - 5 %   Eosinophils Absolute PENDING 0.0 - 0.7 K/uL   Basophils Relative PENDING 0 - 1 %   Basophils Absolute PENDING 0.0 - 0.1 K/uL   WBC Morphology PENDING    RBC Morphology PENDING    Smear Review PENDING    nRBC PENDING 0 /100 WBC   Metamyelocytes Relative PENDING %   Myelocytes PENDING %   Promyelocytes Absolute PENDING %   Blasts PENDING %  POCT urinalysis dip (device)  Result Value Ref Range   Glucose, UA NEGATIVE NEGATIVE mg/dL   Bilirubin Urine SMALL (A) NEGATIVE   Ketones, ur TRACE (A) NEGATIVE mg/dL   Specific Gravity, Urine >=1.030 1.005 - 1.030   Hgb urine dipstick NEGATIVE NEGATIVE   pH 6.0 5.0 - 8.0   Protein, ur 100 (A) NEGATIVE mg/dL   Urobilinogen, UA 0.2 0.0 - 1.0 mg/dL   Nitrite NEGATIVE NEGATIVE  Leukocytes, UA NEGATIVE NEGATIVE  Pregnancy, urine POC  Result Value Ref Range   Preg Test, Ur NEGATIVE NEGATIVE  I-STAT, chem 8  Result Value Ref Range   Sodium 137 135 - 145 mmol/L   Potassium 4.0 3.5 - 5.1 mmol/L   Chloride 105 96 - 112 mEq/L   BUN 8 6 - 23 mg/dL   Creatinine, Ser 0.70 0.50 - 1.10 mg/dL   Glucose, Bld 92 70 - 99 mg/dL   Calcium, Ion 1.16 1.12 - 1.23 mmol/L   TCO2 17 0 - 100 mmol/L   Hemoglobin 16.7 (H) 12.0 - 15.0 g/dL   HCT 49.0 (H) 36.0 - 46.0 %   Assessment    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  Differential diagnosis is vasculitis, erythema multiforme, or erythema nodosum. Suggested follow-up with her primary care nurse practitioner, then referral to rheumatology.  Plan     1.  Meds:  The following meds were prescribed:     Discharge Medication List as of 08/06/2014  7:58 PM    START taking these medications   Details  HYDROcodone-acetaminophen (NORCO/VICODIN) 5-325 MG per tablet 1 to 2 tabs every 4 to 6 hours as needed for pain., Print    meloxicam (MOBIC) 15 MG tablet Take 1 tablet (15 mg total) by mouth daily., Starting 08/06/2014, Until Discontinued, Normal        2.  Patient Education/Counseling:  The patient was given appropriate handouts, self care instructions, and instructed in symptomatic relief.    3.  Follow up:  The patient was told to follow up here if no better in 3 to 4 days, or sooner if becoming worse in any way, and given some red flag symptoms such as worsening rash, fever, or difficulty breathing which would prompt immediate return.  She was particularly warned about any signs of abdominal pain or GI bleeding which would prompt immediate return to the emergency room. I told her that kidney or liver function and bowel is could occur, and that it would be necessary to follow-up with someone in the near future. Follow up here if necessary.      Harden Mo, MD 08/06/14 2014

## 2014-08-11 ENCOUNTER — Emergency Department (HOSPITAL_COMMUNITY)
Admission: EM | Admit: 2014-08-11 | Discharge: 2014-08-11 | Disposition: A | Discharge status: Home / Self Care | Payer: 59 | Attending: Emergency Medicine | Admitting: Emergency Medicine

## 2014-08-11 ENCOUNTER — Encounter (HOSPITAL_COMMUNITY): Payer: Self-pay

## 2014-08-11 DIAGNOSIS — Z87440 Personal history of urinary (tract) infections: Secondary | ICD-10-CM | POA: Diagnosis not present

## 2014-08-11 DIAGNOSIS — I10 Essential (primary) hypertension: Secondary | ICD-10-CM | POA: Diagnosis not present

## 2014-08-11 DIAGNOSIS — R55 Syncope and collapse: Secondary | ICD-10-CM

## 2014-08-11 DIAGNOSIS — M1990 Unspecified osteoarthritis, unspecified site: Secondary | ICD-10-CM | POA: Insufficient documentation

## 2014-08-11 DIAGNOSIS — Z791 Long term (current) use of non-steroidal anti-inflammatories (NSAID): Secondary | ICD-10-CM | POA: Diagnosis not present

## 2014-08-11 DIAGNOSIS — Z720 Tobacco use: Secondary | ICD-10-CM | POA: Diagnosis not present

## 2014-08-11 DIAGNOSIS — D690 Allergic purpura: Secondary | ICD-10-CM

## 2014-08-11 DIAGNOSIS — Z3202 Encounter for pregnancy test, result negative: Secondary | ICD-10-CM | POA: Insufficient documentation

## 2014-08-11 DIAGNOSIS — Z79899 Other long term (current) drug therapy: Secondary | ICD-10-CM | POA: Insufficient documentation

## 2014-08-11 HISTORY — DX: Allergic purpura: D69.0

## 2014-08-11 LAB — CBG MONITORING, ED: Glucose-Capillary: 108 mg/dL — ABNORMAL HIGH (ref 70–99)

## 2014-08-11 LAB — CBC
HCT: 38.5 % (ref 36.0–46.0)
Hemoglobin: 12.8 g/dL (ref 12.0–15.0)
MCH: 27.9 pg (ref 26.0–34.0)
MCHC: 33.2 g/dL (ref 30.0–36.0)
MCV: 84.1 fL (ref 78.0–100.0)
Platelets: 340 10*3/uL (ref 150–400)
RBC: 4.58 MIL/uL (ref 3.87–5.11)
RDW: 14.5 % (ref 11.5–15.5)
WBC: 13.4 10*3/uL — ABNORMAL HIGH (ref 4.0–10.5)

## 2014-08-11 LAB — BASIC METABOLIC PANEL
Anion gap: 12 (ref 5–15)
BUN: 12 mg/dL (ref 6–23)
CO2: 21 mmol/L (ref 19–32)
Calcium: 9.2 mg/dL (ref 8.4–10.5)
Chloride: 101 mEq/L (ref 96–112)
Creatinine, Ser: 0.9 mg/dL (ref 0.50–1.10)
GFR calc Af Amer: >90 mL/min (ref >90)
GFR calc non Af Amer: 85 mL/min — ABNORMAL LOW (ref >90)
Glucose, Bld: 114 mg/dL — ABNORMAL HIGH (ref 70–99)
Potassium: 3.8 mmol/L (ref 3.5–5.1)
Sodium: 134 mmol/L — ABNORMAL LOW (ref 135–145)

## 2014-08-11 LAB — URINALYSIS, ROUTINE W REFLEX MICROSCOPIC
Glucose, UA: NEGATIVE mg/dL
Ketones, ur: 15 mg/dL — AB
Nitrite: NEGATIVE
Protein, ur: 100 mg/dL — AB
Specific Gravity, Urine: 1.036 — ABNORMAL HIGH (ref 1.005–1.030)
Urobilinogen, UA: 1 mg/dL (ref 0.0–1.0)
pH: 7.5 (ref 5.0–8.0)

## 2014-08-11 LAB — HEPATIC FUNCTION PANEL
ALT: 35 U/L (ref 0–35)
AST: 25 U/L (ref 0–37)
Albumin: 4.2 g/dL (ref 3.5–5.2)
Alkaline Phosphatase: 66 U/L (ref 39–117)
Bilirubin, Direct: <0.1 mg/dL (ref 0.0–0.3)
Total Bilirubin: 0.6 mg/dL (ref 0.3–1.2)
Total Protein: 8.7 g/dL — ABNORMAL HIGH (ref 6.0–8.3)

## 2014-08-11 LAB — POC URINE PREG, ED: Preg Test, Ur: NEGATIVE

## 2014-08-11 LAB — URINE MICROSCOPIC-ADD ON

## 2014-08-11 LAB — SEDIMENTATION RATE: Sed Rate: 51 mm/hr — ABNORMAL HIGH (ref 0–22)

## 2014-08-11 MED ORDER — METHYLPREDNISOLONE SODIUM SUCC 125 MG IJ SOLR
125.0000 mg | Freq: Once | INTRAMUSCULAR | Status: AC
  Administered 2014-08-11: 125 mg via INTRAVENOUS
  Filled 2014-08-11: qty 2

## 2014-08-11 MED ORDER — PREDNISONE 20 MG PO TABS: ORAL_TABLET | ORAL | Status: DC

## 2014-08-11 MED ORDER — HYDROCODONE-ACETAMINOPHEN 5-325 MG PO TABS: 1.0000 | ORAL_TABLET | ORAL | Status: DC | PRN

## 2014-08-11 MED ORDER — MORPHINE SULFATE 4 MG/ML IJ SOLN: 4.0000 mg | INTRAMUSCULAR | Status: DC | PRN

## 2014-08-11 MED ORDER — ONDANSETRON HCL 4 MG/2ML IJ SOLN
4.0000 mg | Freq: Once | INTRAMUSCULAR | Status: AC
  Administered 2014-08-11: 4 mg via INTRAVENOUS
  Filled 2014-08-11: qty 2

## 2014-08-11 MED ORDER — ONDANSETRON 4 MG PO TBDP: 4.0000 mg | ORAL_TABLET | Freq: Three times a day (TID) | ORAL | Status: DC | PRN

## 2014-08-11 MED ORDER — KETOROLAC TROMETHAMINE 30 MG/ML IJ SOLN
30.0000 mg | Freq: Once | INTRAMUSCULAR | Status: AC
  Administered 2014-08-11: 30 mg via INTRAVENOUS
  Filled 2014-08-11: qty 1

## 2014-08-11 MED ORDER — SODIUM CHLORIDE 0.9 % IV BOLUS (SEPSIS)
500.0000 mL | Freq: Once | INTRAVENOUS | Status: AC
Start: 2014-08-11 — End: 2014-08-11
  Administered 2014-08-11: 500 mL via INTRAVENOUS

## 2014-08-11 NOTE — ED Provider Notes (Signed)
CSN: [id number]     Arrival date & time 08/11/14  0907 History   First MD Initiated Contact with Patient 08/11/14 0912     Chief Complaint  Patient presents with  . Loss of Consciousness      HPI  History presents for evaluation of a rash a loss of consciousness. Seen recently at urgent care and diagnosed with purpura of her lower extremities. Also has some developing pain in her ankles, knees, both elbows, particularly her right wrist. She was in the car on the way over when she moves her wrist without pain. She felt lightheaded and dizzy. Felt nauseated. She had what sounds like a vagal reaction. She states she felt like everything was closing in on her. She was out for a few seconds. Felt nauseated. This is all resolved.  She's had the rash for approximately 10 days. Seen in urgent care and diagnosed with HSP. Placed on naproxen. Had no abdominal pain, bloody stools, blood in her urine. Has developed arthritis in the interval.  Past Medical History  Diagnosis Date  . Hypertension 08/2008    NO MEDS  . Type o blood, rh negative     O-  . Urinary tract infection   . FH: multiple miscarriages or stillbirths   . Henoch-Schonlein purpura    Past Surgical History  Procedure Laterality Date  . Abortions    . Wisdom tooth extraction     Family History  Problem Relation Age of Onset  . Diabetes Father   . Hypertension Father   . Cancer Maternal Grandmother     THROAT CA  . Hypertension Paternal Grandmother    History  Substance Use Topics  . Smoking status: Current Some Day Smoker -- 0.25 packs/day for 3 years    Types: Cigarettes  . Smokeless tobacco: Not on file  . Alcohol Use: Yes   OB History    Gravida Para Term Preterm AB TAB SAB Ectopic Multiple Living   5    5 1 4    0     Review of Systems  Constitutional: Negative for fever, chills, diaphoresis, appetite change and fatigue.  HENT: Negative for mouth sores, sore throat and trouble swallowing.   Eyes: Negative  for visual disturbance.  Respiratory: Negative for cough, chest tightness, shortness of breath and wheezing.   Cardiovascular: Negative for chest pain.  Gastrointestinal: Negative for nausea, vomiting, abdominal pain, diarrhea and abdominal distention.  Endocrine: Negative for polydipsia, polyphagia and polyuria.  Genitourinary: Negative for dysuria, frequency and hematuria.  Musculoskeletal: Positive for myalgias, joint swelling and arthralgias. Negative for gait problem.  Skin: Positive for rash. Negative for color change and pallor.  Neurological: Positive for syncope. Negative for dizziness, light-headedness and headaches.  Hematological: Does not bruise/bleed easily.  Psychiatric/Behavioral: Negative for behavioral problems and confusion.      Allergies  Review of patient's allergies indicates no known allergies.  Home Medications   Prior to Admission medications   Medication Sig Start Date End Date Taking? Authorizing Provider  hydrochlorothiazide (MICROZIDE) 12.5 MG capsule Take 12.5 mg by mouth every morning.   Yes Historical Provider, MD  ibuprofen (ADVIL,MOTRIN) 200 MG tablet Take 400 mg by mouth every 6 (six) hours as needed for headache or moderate pain.   Yes Historical Provider, MD  meloxicam (MOBIC) 15 MG tablet Take 1 tablet (15 mg total) by mouth daily. 08/06/14  Yes Harden Mo, MD  fluconazole (DIFLUCAN) 150 MG tablet Take 1 tablet (150 mg total) by mouth once.  Patient not taking: Reported on 08/11/2014 10/06/13   Huel Cote, NP  HYDROcodone-acetaminophen (NORCO/VICODIN) 5-325 MG per tablet Take 1 tablet by mouth every 4 (four) hours as needed. 08/11/14   Tanna Furry, MD  metroNIDAZOLE (FLAGYL) 500 MG tablet Take 1 tablet (500 mg total) by mouth 2 (two) times daily. Patient not taking: Reported on 08/11/2014 04/08/14   Huel Cote, NP  ondansetron (ZOFRAN ODT) 4 MG disintegrating tablet Take 1 tablet (4 mg total) by mouth every 8 (eight) hours as needed for nausea.  08/11/14   Tanna Furry, MD  predniSONE (DELTASONE) 20 MG tablet 1 po bid x 5 days, then 1 po q day 08/11/14   Tanna Furry, MD   BP 139/91 mmHg  Pulse 85  Temp(Src) 98.1 F (36.7 C) (Oral)  Resp 17  SpO2 100%  LMP 07/09/2014 Physical Exam  Constitutional: She is oriented to person, place, and time. She appears well-developed and well-nourished. No distress.  HENT:  Head: Normocephalic.  Eyes: Conjunctivae are normal. Pupils are equal, round, and reactive to light. No scleral icterus.  Neck: Normal range of motion. Neck supple. No thyromegaly present.  Cardiovascular: Normal rate and regular rhythm.  Exam reveals no gallop and no friction rub.   No murmur heard. Pulmonary/Chest: Effort normal and breath sounds normal. No respiratory distress. She has no wheezes. She has no rales.  Abdominal: Soft. Bowel sounds are normal. She exhibits no distension. There is no tenderness. There is no rebound.  Musculoskeletal: Normal range of motion.  With range of motion of ankles and feet and knees. Also bilateral elbows. No demonstrable erythema or soft tissue swelling or frank arthritis noted. Right wrist joint show soft tissue swelling erythema and warmth and painful range of motion.  Neurological: She is alert and oriented to person, place, and time.  Skin: Skin is warm and dry. No rash noted.  Rashes lower extremities consistent with HSP. Areas of tender purpura under the skin. No petechial abnormality's. No rash to the upper extremities.  No palmar rash.  Psychiatric: She has a normal mood and affect. Her behavior is normal.    ED Course  Procedures (including critical care time) Labs Review Labs Reviewed  CBC - Abnormal; Notable for the following:    WBC 13.4 (*)    All other components within normal limits  BASIC METABOLIC PANEL - Abnormal; Notable for the following:    Sodium 134 (*)    Glucose, Bld 114 (*)    GFR calc non Af Amer 85 (*)    All other components within normal limits   URINALYSIS, ROUTINE W REFLEX MICROSCOPIC - Abnormal; Notable for the following:    Color, Urine ORANGE (*)    APPearance HAZY (*)    Specific Gravity, Urine 1.036 (*)    Hgb urine dipstick MODERATE (*)    Bilirubin Urine SMALL (*)    Ketones, ur 15 (*)    Protein, ur 100 (*)    Leukocytes, UA SMALL (*)    All other components within normal limits  SEDIMENTATION RATE - Abnormal; Notable for the following:    Sed Rate 51 (*)    All other components within normal limits  HEPATIC FUNCTION PANEL - Abnormal; Notable for the following:    Total Protein 8.7 (*)    All other components within normal limits  URINE MICROSCOPIC-ADD ON - Abnormal; Notable for the following:    Squamous Epithelial / LPF FEW (*)    Bacteria, UA MANY (*)  Casts HYALINE CASTS (*)    All other components within normal limits  CBG MONITORING, ED - Abnormal; Notable for the following:    Glucose-Capillary 108 (*)    All other components within normal limits  POC URINE PREG, ED    Imaging Review No results found.   EKG Interpretation   Date/Time:  Thursday August 11 2014 09:08:34 EST Ventricular Rate:  83 PR Interval:  136 QRS Duration: 76 QT Interval:  362 QTC Calculation: 425 R Axis:   65 Text Interpretation:  ED PHYSICIAN INTERPRETATION AVAILABLE IN [place]  [place] Confirmed by TEST, Record ([id number]) on 08/13/2014 9:06:28 AM      MDM   Final diagnoses:  Henoch Schonlein syndrome  Arthritis  Vaso-vagal reaction    Patient with rash and symptoms consistent with Henoch-Schnlein purpura.  Does have signs of arthritis with soft tissue swelling pain and tenderness about the right wrist. Less so than left elbow, bilateral knees, bilateral ankles. No swelling or effusions noted of the joints with the exception of the right wrist.  Symptoms are well-controlled after the above medications. Urine does show trace protein and trace red blood cells. She is menstrual. She declines catheter. Kidney function  is normal. Leukocytosis at 13.4. Laboratory marker with sedimentation rate elevated at 51.  I think her episode of syncope was likely vasovagal. She is healing quite stable here. Normal EKG. Normal hemoglobin. Not pregnant. I discussed with her that I would like to have her follow with rheumatology. We do not have on-call rheumatology available. I did place a call to relieve an office message with [facility] requesting follow-up appointment. I've given her this information. Also given her information regarding [employer]. Her primary care had been to her NP at her OB/GYN's office and she states she is seeking a primary care physician.  I think she is appropriate for outpatient treatment. Plan will be twice a day prednisone 2 days, followed by daily prednisone. Follow-up as above. ER with acute changes.  We discussed uncertain etiology of Henoch-Schnlein purpura. We discussed timeline as being from several days to several weeks. She is understanding of this.    Tanna Furry, MD 08/15/14 [phone]

## 2014-08-11 NOTE — ED Notes (Addendum)
Pt had loss of consciousness in the car preceded by acute SOB and dizziness on her way to be seen in ED for pain, numbness, and swelling in her right hand where she has a wrist brace in place for pains, pt recently diagnosed with Henoch-Schonlein purpura, states legs are still painful and swollen. Pt denies any auras prior to LOC. On assessment, legs are swollen with purple round quarter-sized areas of darkened pigment, right ring finger is swollen, swollen round mass on right posterior wrist, sensation and motor function intact throughout, pulses 2+ present bilateral wrists and feet.

## 2014-08-11 NOTE — Discharge Instructions (Signed)
I recommend that you see a rheumatologist. We do not have on-call rheumatologists. I left a message with [facility]. Call for appointment. You may need to be seen by primary care for formal referral.   Henoch-Schonlein Purpura This is a soreness (inflammation) of the blood vessels and/or capillaries. It is seen as red to purple spots on the skin. These can usually be felt as a raised rash. It usually occurs in the fall, winter, and spring but rarely in the summer. There may also be joint pain (around the knees and ankles), belly (abdominal) pain, and kidney problems. It usually occurs in children ages 9 to 79, but adults may also be affected. It is more common in boys. The rash will usually show up on the legs and buttocks. This happens before other problems such as abdominal pain and arthritis (inflammation of the joints). The rash can spread to the face and body (trunk). CAUSES  The cause is not known. It may be an abnormal response by the immune system. That is the system which usually keeps [place] from getting sick. It is sometimes seen with or following allergies, drug sensitivities, vaccinations, and infections.  SYMPTOMS   Primarily, redness and swelling of the skin caused by congestion of the capillaries.  Lack of energy.  Joint pains.  Abdominal pain.  Bloody stools.  Purple spots (wheals) - usually on the lower extremities and buttocks, but may involve elbows, trunk, and face.  Low-grade fever.  Nausea/vomiting.  Diarrhea.  Bloody urine. DIAGNOSIS   Your caregiver can diagnose this condition based on an examination. He or she also may do some blood and urine tests.  Biopsies are sometimes done. In this test, a small piece of tissue (your skin) is taken to be examined by a specialist under a microscope. This is used to help confirm a diagnosis if there is uncertainty. TREATMENT   Treatment is directed at symptoms or, along with your caregiver, you may  just watch and see. There are not any specific treatments available.  This condition usually resolves spontaneously within 6 to 16 weeks without treatment. But it may recur several times before complete remission.  Bed rest.  Drink plenty of fluids as suggested by your caregiver to avoid dehydration.  Nonsteroidal anti-inflammatory drugs (NSAIDs) or acetaminophen maybe used for aches and fever as directed by your caregiver. Only take over-the-counter or prescription medicines for pain, discomfort, or fever as directed by your caregiver.  Corticosteroid therapy may be used for the central nervous system problems, kidney (nephritic) syndrome, or complications, such as intestinal hemorrhage, obstruction, or perforation.  Medications are available to treat kidney complications.  Most children get well. But some children can develop kidney failure (end stage renal disease). COMPLICATIONS  Bleeding in the lungs (pulmonary hemorrhage).  Bleeding into the intestines (intestinal hemorrhage).  Food is blocked from going through the intestines (intestinal obstruction).  There is a telescoping of the small intestine (bowel) on itself (intussusception).  A hole in the intestines (intestinal perforation).  Death can occur from gastrointestinal complications, renal failure, or central nervous system involvement. These bad outcomes are very rare. SEEK IMMEDIATE MEDICAL CARE IF:   There is severe abdominal pain or nausea and vomiting.  There is severe headache or joint pain not relieved with medicine.  There is blood in the urine or urination stops.  There is increasing swelling and pain in the joints.  Your child feels light-headed or faint. Document Released: 06/11/2004 Document Revised: 11/29/2013 Document Reviewed: 12/23/2008 [employer]  Patient Information 2015 [place]. This information is not intended to replace advice given to you by your health care provider. Make sure you discuss  any questions you have with your health care provider.  Arthritis, Nonspecific Arthritis is inflammation of a joint. This usually means pain, redness, warmth or swelling are present. One or more joints may be involved. There are a number of types of arthritis. Your caregiver may not be able to tell what type of arthritis you have right away. CAUSES  The most common cause of arthritis is the wear and tear on the joint (osteoarthritis). This causes damage to the cartilage, which can break down over time. The knees, hips, back and neck are most often affected by this type of arthritis. Other types of arthritis and common causes of joint pain include:  Sprains and other injuries near the joint. Sometimes minor sprains and injuries cause pain and swelling that develop hours later.  Rheumatoid arthritis. This affects hands, feet and knees. It usually affects both sides of your body at the same time. It is often associated with chronic ailments, fever, weight loss and general weakness.  Crystal arthritis. Gout and pseudo gout can cause occasional acute severe pain, redness and swelling in the foot, ankle, or knee.  Infectious arthritis. Bacteria can get into a joint through a break in overlying skin. This can cause infection of the joint. Bacteria and viruses can also spread through the blood and affect your joints.  Drug, infectious and allergy reactions. Sometimes joints can become mildly painful and slightly swollen with these types of illnesses. SYMPTOMS   Pain is the main symptom.  Your joint or joints can also be red, swollen and warm or hot to the touch.  You may have a fever with certain types of arthritis, or even feel overall ill.  The joint with arthritis will hurt with movement. Stiffness is present with some types of arthritis. DIAGNOSIS  Your caregiver will suspect arthritis based on your description of your symptoms and on your exam. Testing may be needed to find the type of  arthritis:  Blood and sometimes urine tests.  X-ray tests and sometimes CT or MRI scans.  Removal of fluid from the joint (arthrocentesis) is done to check for bacteria, crystals or other causes. Your caregiver (or a specialist) will numb the area over the joint with a local anesthetic, and use a needle to remove joint fluid for examination. This procedure is only minimally uncomfortable.  Even with these tests, your caregiver may not be able to tell what kind of arthritis you have. Consultation with a specialist (rheumatologist) may be helpful. TREATMENT  Your caregiver will discuss with you treatment specific to your type of arthritis. If the specific type cannot be determined, then the following general recommendations may apply. Treatment of severe joint pain includes:  Rest.  Elevation.  Anti-inflammatory medication (for example, ibuprofen) may be prescribed. Avoiding activities that cause increased pain.  Only take over-the-counter or prescription medicines for pain and discomfort as recommended by your caregiver.  Cold packs over an inflamed joint may be used for 10 to 15 minutes every hour. Hot packs sometimes feel better, but do not use overnight. Do not use hot packs if you are diabetic without your caregiver's permission.  A cortisone shot into arthritic joints may help reduce pain and swelling.  Any acute arthritis that gets worse over the next 1 to 2 days needs to be looked at to be sure there is no joint  infection. Long-term arthritis treatment involves modifying activities and lifestyle to reduce joint stress jarring. This can include weight loss. Also, exercise is needed to nourish the joint cartilage and remove waste. This helps keep the muscles around the joint strong. HOME CARE INSTRUCTIONS   Do not take aspirin to relieve pain if gout is suspected. This elevates uric acid levels.  Only take over-the-counter or prescription medicines for pain, discomfort or fever as  directed by your caregiver.  Rest the joint as much as possible.  If your joint is swollen, keep it elevated.  Use crutches if the painful joint is in your leg.  Drinking plenty of fluids may help for certain types of arthritis.  Follow your caregiver's dietary instructions.  Try low-impact exercise such as:  Swimming.  Water aerobics.  Biking.  Walking.  Morning stiffness is often relieved by a warm shower.  Put your joints through regular range-of-motion. SEEK MEDICAL CARE IF:   You do not feel better in 24 hours or are getting worse.  You have side effects to medications, or are not getting better with treatment. SEEK IMMEDIATE MEDICAL CARE IF:   You have a fever.  You develop severe joint pain, swelling or redness.  Many joints are involved and become painful and swollen.  There is severe back pain and/or leg weakness.  You have loss of bowel or bladder control. Document Released: 08/22/2004 Document Revised: 10/07/2011 Document Reviewed: 09/07/2008 [facility] Patient Information 2015 [place], [place]. This information is not intended to replace advice given to you by your health care provider. Make sure you discuss any questions you have with your health care provider.  Vasovagal Syncope, Adult Syncope, commonly known as fainting, is a temporary loss of consciousness. It occurs when the blood flow to the brain is reduced. Vasovagal syncope (also called neurocardiogenic syncope) is a fainting spell in which the blood flow to the brain is reduced because of a sudden drop in heart rate and blood pressure. Vasovagal syncope occurs when the brain and the cardiovascular system (blood vessels) do not adequately communicate and respond to each other. This is the most common cause of fainting. It often occurs in response to fear or some other type of emotional or physical stress. The body has a reaction in which the heart starts beating too slowly or the blood vessels expand,  reducing blood pressure. This type of fainting spell is generally considered harmless. However, injuries can occur if a person takes a sudden fall during a fainting spell.  CAUSES  Vasovagal syncope occurs when a person's blood pressure and heart rate decrease suddenly, usually in response to a trigger. Many things and situations can trigger an episode. Some of these include:   Pain.   Fear.   The sight of blood or medical procedures, such as blood being drawn from a vein.   Common activities, such as coughing, swallowing, stretching, or going to the bathroom.   Emotional stress.   Prolonged standing, especially in a warm environment.   Lack of sleep or rest.   Prolonged lack of food.   Prolonged lack of fluids.   Recent illness.  The use of certain drugs that affect blood pressure, such as cocaine, alcohol, marijuana, inhalants, and opiates.  SYMPTOMS  Before the fainting episode, you may:   Feel dizzy or light headed.   Become pale.  Sense that you are going to faint.   Feel like the room is spinning.   Have tunnel vision, only seeing directly in front  of you.   Feel sick to your stomach (nauseous).   See spots or slowly lose vision.   Hear ringing in your ears.   Have a headache.   Feel warm and sweaty.   Feel a sensation of pins and needles. During the fainting spell, you will generally be unconscious for no longer than a couple minutes before waking up and returning to normal. If you get up too quickly before your body can recover, you may faint again. Some twitching or jerky movements may occur during the fainting spell.  DIAGNOSIS  Your caregiver will ask about your symptoms, take a medical history, and perform a physical exam. Various tests may be done to rule out other causes of fainting. These may include blood tests and tests to check the heart, such as electrocardiography, echocardiography, and possibly an electrophysiology study. When  other causes have been ruled out, a test may be done to check the body's response to changes in position (tilt table test). TREATMENT  Most cases of vasovagal syncope do not require treatment. Your caregiver may recommend ways to avoid fainting triggers and may provide home strategies for preventing fainting. If you must be exposed to a possible trigger, you can drink additional fluids to help reduce your chances of having an episode of vasovagal syncope. If you have warning signs of an oncoming episode, you can respond by positioning yourself favorably (lying down). If your fainting spells continue, you may be given medicines to prevent fainting. Some medicines may help make you more resistant to repeated episodes of vasovagal syncope. Special exercises or compression stockings may be recommended. In rare cases, the surgical placement of a pacemaker is considered. HOME CARE INSTRUCTIONS   Learn to identify the warning signs of vasovagal syncope.   Sit or lie down at the first warning sign of a fainting spell. If sitting, put your head down between your legs. If you lie down, swing your legs up in the air to increase blood flow to the brain.   Avoid hot tubs and saunas.  Avoid prolonged standing.  Drink enough fluids to keep your urine clear or pale yellow. Avoid caffeine.  Increase salt in your diet as directed by your caregiver.   If you have to stand for a long time, perform movements such as:   Crossing your legs.   Flexing and stretching your leg muscles.   Squatting.   Moving your legs.   Bending over.   Only take over-the-counter or prescription medicines as directed by your caregiver. Do not suddenly stop any medicines without asking your caregiver first. SEEK MEDICAL CARE IF:   Your fainting spells continue or happen more frequently in spite of treatment.   You lose consciousness for more than a couple minutes.  You have fainting spells during or after  exercising or after being startled.   You have new symptoms that occur with the fainting spells, such as:   Shortness of breath.  Chest pain.   Irregular heartbeat.   You have episodes of twitching or jerky movements that last longer than a few seconds.  You have episodes of twitching or jerky movements without obvious fainting. SEEK IMMEDIATE MEDICAL CARE IF:   You have injuries or bleeding after a fainting spell.   You have episodes of twitching or jerky movements that last longer than 5 minutes.   You have more than one spell of twitching or jerky movements before returning to consciousness after fainting. MAKE SURE YOU:   Understand these  instructions.  Will watch your condition.  Will get help right away if you are not doing well or get worse. Document Released: 07/01/2012 Document Reviewed: 07/01/2012 [facility] Patient Information 2015 [place]. This information is not intended to replace advice given to you by your health care provider. Make sure you discuss any questions you have with your health care provider.

## 2014-10-28 ENCOUNTER — Encounter: Payer: Self-pay | Admitting: Women's Health

## 2014-11-04 ENCOUNTER — Encounter: Payer: Self-pay | Admitting: Women's Health

## 2015-01-26 ENCOUNTER — Encounter: Payer: Self-pay | Admitting: Women's Health

## 2015-02-01 ENCOUNTER — Encounter: Payer: Self-pay | Admitting: Women's Health

## 2015-03-01 ENCOUNTER — Encounter: Payer: Self-pay | Admitting: Women's Health

## 2015-03-01 ENCOUNTER — Other Ambulatory Visit (HOSPITAL_COMMUNITY)
Admission: RE | Admit: 2015-03-01 | Discharge: 2015-03-01 | Disposition: A | Discharge status: Home / Self Care | Payer: 59 | Source: Ambulatory Visit | Attending: Women's Health | Admitting: Women's Health

## 2015-03-01 ENCOUNTER — Ambulatory Visit (INDEPENDENT_AMBULATORY_CARE_PROVIDER_SITE_OTHER): Payer: 59 | Admitting: Women's Health

## 2015-03-01 VITALS — BP 164/92 | Wt 181.0 lb

## 2015-03-01 DIAGNOSIS — R87810 Cervical high risk human papillomavirus (HPV) DNA test positive: Secondary | ICD-10-CM | POA: Insufficient documentation

## 2015-03-01 DIAGNOSIS — Z01419 Encounter for gynecological examination (general) (routine) without abnormal findings: Secondary | ICD-10-CM | POA: Diagnosis not present

## 2015-03-01 DIAGNOSIS — Z13220 Encounter for screening for lipoid disorders: Secondary | ICD-10-CM | POA: Diagnosis not present

## 2015-03-01 DIAGNOSIS — Z01411 Encounter for gynecological examination (general) (routine) with abnormal findings: Secondary | ICD-10-CM | POA: Insufficient documentation

## 2015-03-01 DIAGNOSIS — Z1151 Encounter for screening for human papillomavirus (HPV): Secondary | ICD-10-CM | POA: Insufficient documentation

## 2015-03-01 LAB — CBC WITH DIFFERENTIAL/PLATELET
Basophils Absolute: 0 10*3/uL (ref 0.0–0.1)
Basophils Relative: 0 % (ref 0–1)
Eosinophils Absolute: 0.2 10*3/uL (ref 0.0–0.7)
Eosinophils Relative: 2 % (ref 0–5)
HCT: 41.4 % (ref 36.0–46.0)
Hemoglobin: 13.6 g/dL (ref 12.0–15.0)
Lymphocytes Relative: 39 % (ref 12–46)
Lymphs Abs: 3.7 10*3/uL (ref 0.7–4.0)
MCH: 28.1 pg (ref 26.0–34.0)
MCHC: 32.9 g/dL (ref 30.0–36.0)
MCV: 85.5 fL (ref 78.0–100.0)
MPV: 10.8 fL (ref 8.6–12.4)
Monocytes Absolute: 0.5 10*3/uL (ref 0.1–1.0)
Monocytes Relative: 5 % (ref 3–12)
Neutro Abs: 5.1 10*3/uL (ref 1.7–7.7)
Neutrophils Relative %: 54 % (ref 43–77)
Platelets: 392 10*3/uL (ref 150–400)
RBC: 4.84 MIL/uL (ref 3.87–5.11)
RDW: 15.3 % (ref 11.5–15.5)
WBC: 9.5 10*3/uL (ref 4.0–10.5)

## 2015-03-01 LAB — LIPID PANEL
Cholesterol: 200 mg/dL (ref 125–200)
HDL: 30 mg/dL — ABNORMAL LOW (ref >=46)
LDL Cholesterol: 137 mg/dL — ABNORMAL HIGH (ref <130)
Total CHOL/HDL Ratio: 6.7 Ratio — ABNORMAL HIGH (ref <=5.0)
Triglycerides: 164 mg/dL — ABNORMAL HIGH (ref <150)
VLDL: 33 mg/dL — ABNORMAL HIGH (ref <30)

## 2015-03-01 LAB — COMPREHENSIVE METABOLIC PANEL
ALT: 15 U/L (ref 6–29)
AST: 15 U/L (ref 10–30)
Albumin: 4.1 g/dL (ref 3.6–5.1)
Alkaline Phosphatase: 61 U/L (ref 33–115)
BUN: 11 mg/dL (ref 7–25)
CO2: 21 mmol/L (ref 20–31)
Calcium: 9.3 mg/dL (ref 8.6–10.2)
Chloride: 106 mmol/L (ref 98–110)
Creat: 0.76 mg/dL (ref 0.50–1.10)
Glucose, Bld: 73 mg/dL (ref 65–99)
Potassium: 4.4 mmol/L (ref 3.5–5.3)
Sodium: 138 mmol/L (ref 135–146)
Total Bilirubin: 0.3 mg/dL (ref 0.2–1.2)
Total Protein: 7.5 g/dL (ref 6.1–8.1)

## 2015-03-01 LAB — TSH: TSH: 1.115 u[IU]/mL (ref 0.350–4.500)

## 2015-03-01 NOTE — Patient Instructions (Signed)
Health Maintenance Adopting a healthy lifestyle and getting preventive care can go a long way to promote health and wellness. Talk with your health care provider about what schedule of regular examinations is right for you. This is a good chance for you to check in with your provider about disease prevention and staying healthy. In between checkups, there are plenty of things you can do on your own. Experts have done a lot of research about which lifestyle changes and preventive measures are most likely to keep you healthy. Ask your health care provider for more information. WEIGHT AND DIET  Eat a healthy diet  Be sure to include plenty of vegetables, fruits, low-fat dairy products, and lean protein.  Do not eat a lot of foods high in solid fats, added sugars, or salt.  Get regular exercise. This is one of the most important things you can do for your health.  Most adults should exercise for at least 150 minutes each week. The exercise should increase your heart rate and make you sweat (moderate-intensity exercise).  Most adults should also do strengthening exercises at least twice a week. This is in addition to the moderate-intensity exercise.  Maintain a healthy weight  Body mass index (BMI) is a measurement that can be used to identify possible weight problems. It estimates body fat based on height and weight. Your health care provider can help determine your BMI and help you achieve or maintain a healthy weight.  For females 25 years of age and older:   A BMI below 18.5 is considered underweight.  A BMI of 18.5 to 24.9 is normal.  A BMI of 25 to 29.9 is considered overweight.  A BMI of 30 and above is considered obese.  Watch levels of cholesterol and blood lipids  You should start having your blood tested for lipids and cholesterol at 31 years of age, then have this test every 5 years.  You may need to have your cholesterol levels checked more often if:  Your lipid or  cholesterol levels are high.  You are older than 31 years of age.  You are at high risk for heart disease.  CANCER SCREENING   Lung Cancer  Lung cancer screening is recommended for adults 97-92 years old who are at high risk for lung cancer because of a history of smoking.  A yearly low-dose CT scan of the lungs is recommended for people who:  Currently smoke.  Have quit within the past 15 years.  Have at least a 30-pack-year history of smoking. A pack year is smoking an average of one pack of cigarettes a day for 1 year.  Yearly screening should continue until it has been 15 years since you quit.  Yearly screening should stop if you develop a health problem that would prevent you from having lung cancer treatment.  Breast Cancer  Practice breast self-awareness. This means understanding how your breasts normally appear and feel.  It also means doing regular breast self-exams. Let your health care provider know about any changes, no matter how small.  If you are in your 20s or 30s, you should have a clinical breast exam (CBE) by a health care provider every 1-3 years as part of a regular health exam.  If you are 76 or older, have a CBE every year. Also consider having a breast X-ray (mammogram) every year.  If you have a family history of breast cancer, talk to your health care provider about genetic screening.  If you are  at high risk for breast cancer, talk to your health care provider about having an MRI and a mammogram every year.  Breast cancer gene (BRCA) assessment is recommended for women who have family members with BRCA-related cancers. BRCA-related cancers include:  Breast.  Ovarian.  Tubal.  Peritoneal cancers.  Results of the assessment will determine the need for genetic counseling and BRCA1 and BRCA2 testing. Cervical Cancer Routine pelvic examinations to screen for cervical cancer are no longer recommended for nonpregnant women who are considered low  risk for cancer of the pelvic organs (ovaries, uterus, and vagina) and who do not have symptoms. A pelvic examination may be necessary if you have symptoms including those associated with pelvic infections. Ask your health care provider if a screening pelvic exam is right for you.   The Pap test is the screening test for cervical cancer for women who are considered at risk.  If you had a hysterectomy for a problem that was not cancer or a condition that could lead to cancer, then you no longer need Pap tests.  If you are older than 65 years, and you have had normal Pap tests for the past 10 years, you no longer need to have Pap tests.  If you have had past treatment for cervical cancer or a condition that could lead to cancer, you need Pap tests and screening for cancer for at least 20 years after your treatment.  If you no longer get a Pap test, assess your risk factors if they change (such as having a new sexual partner). This can affect whether you should start being screened again.  Some women have medical problems that increase their chance of getting cervical cancer. If this is the case for you, your health care provider may recommend more frequent screening and Pap tests.  The human papillomavirus (HPV) test is another test that may be used for cervical cancer screening. The HPV test looks for the virus that can cause cell changes in the cervix. The cells collected during the Pap test can be tested for HPV.  The HPV test can be used to screen women 30 years of age and older. Getting tested for HPV can extend the interval between normal Pap tests from three to five years.  An HPV test also should be used to screen women of any age who have unclear Pap test results.  After 30 years of age, women should have HPV testing as often as Pap tests.  Colorectal Cancer  This type of cancer can be detected and often prevented.  Routine colorectal cancer screening usually begins at 31 years of  age and continues through 31 years of age.  Your health care provider may recommend screening at an earlier age if you have risk factors for colon cancer.  Your health care provider may also recommend using home test kits to check for hidden blood in the stool.  A small camera at the end of a tube can be used to examine your colon directly (sigmoidoscopy or colonoscopy). This is done to check for the earliest forms of colorectal cancer.  Routine screening usually begins at age 50.  Direct examination of the colon should be repeated every 5-10 years through 31 years of age. However, you may need to be screened more often if early forms of precancerous polyps or small growths are found. Skin Cancer  Check your skin from head to toe regularly.  Tell your health care provider about any new moles or changes in   moles, especially if there is a change in a mole's shape or color.  Also tell your health care provider if you have a mole that is larger than the size of a pencil eraser.  Always use sunscreen. Apply sunscreen liberally and repeatedly throughout the day.  Protect yourself by wearing long sleeves, pants, a wide-brimmed hat, and sunglasses whenever you are outside. HEART DISEASE, DIABETES, AND HIGH BLOOD PRESSURE   Have your blood pressure checked at least every 1-2 years. High blood pressure causes heart disease and increases the risk of stroke.  If you are between 75 years and 42 years old, ask your health care provider if you should take aspirin to prevent strokes.  Have regular diabetes screenings. This involves taking a blood sample to check your fasting blood sugar level.  If you are at a normal weight and have a low risk for diabetes, have this test once every three years after 31 years of age.  If you are overweight and have a high risk for diabetes, consider being tested at a younger age or more often. PREVENTING INFECTION  Hepatitis B  If you have a higher risk for  hepatitis B, you should be screened for this virus. You are considered at high risk for hepatitis B if:  You were born in a country where hepatitis B is common. Ask your health care provider which countries are considered high risk.  Your parents were born in a high-risk country, and you have not been immunized against hepatitis B (hepatitis B vaccine).  You have HIV or AIDS.  You use needles to inject street drugs.  You live with someone who has hepatitis B.  You have had sex with someone who has hepatitis B.  You get hemodialysis treatment.  You take certain medicines for conditions, including cancer, organ transplantation, and autoimmune conditions. Hepatitis C  Blood testing is recommended for:  Everyone born from [birth year] through 1967.  Anyone with known risk factors for hepatitis C. Sexually transmitted infections (STIs)  You should be screened for sexually transmitted infections (STIs) including gonorrhea and chlamydia if:  You are sexually active and are younger than 31 years of age.  You are older than 31 years of age and your health care provider tells you that you are at risk for this type of infection.  Your sexual activity has changed since you were last screened and you are at an increased risk for chlamydia or gonorrhea. Ask your health care provider if you are at risk.  If you do not have HIV, but are at risk, it may be recommended that you take a prescription medicine daily to prevent HIV infection. This is called pre-exposure prophylaxis (PrEP). You are considered at risk if:  You are sexually active and do not regularly use condoms or know the HIV status of your partner(s).  You take drugs by injection.  You are sexually active with a partner who has HIV. Talk with your health care provider about whether you are at high risk of being infected with HIV. If you choose to begin PrEP, you should first be tested for HIV. You should then be tested every 3 months for  as long as you are taking PrEP.  PREGNANCY   If you are premenopausal and you may become pregnant, ask your health care provider about preconception counseling.  If you may become pregnant, take 400 to 800 micrograms (mcg) of folic acid every day.  If you want to prevent pregnancy, talk to your  health care provider about birth control (contraception). OSTEOPOROSIS AND MENOPAUSE   Osteoporosis is a disease in which the bones lose minerals and strength with aging. This can result in serious bone fractures. Your risk for osteoporosis can be identified using a bone density scan.  If you are 40 years of age or older, or if you are at risk for osteoporosis and fractures, ask your health care provider if you should be screened.  Ask your health care provider whether you should take a calcium or vitamin D supplement to lower your risk for osteoporosis.  Menopause may have certain physical symptoms and risks.  Hormone replacement therapy may reduce some of these symptoms and risks. Talk to your health care provider about whether hormone replacement therapy is right for you.  HOME CARE INSTRUCTIONS   Schedule regular health, dental, and eye exams.  Stay current with your immunizations.   Do not use any tobacco products including cigarettes, chewing tobacco, or electronic cigarettes.  If you are pregnant, do not drink alcohol.  If you are breastfeeding, limit how much and how often you drink alcohol.  Limit alcohol intake to no more than 1 drink per day for nonpregnant women. One drink equals 12 ounces of beer, 5 ounces of wine, or 1 ounces of hard liquor.  Do not use street drugs.  Do not share needles.  Ask your health care provider for help if you need support or information about quitting drugs.  Tell your health care provider if you often feel depressed.  Tell your health care provider if you have ever been abused or do not feel safe at home. Document Released: 01/28/2011  Document Revised: 11/29/2013 Document Reviewed: 06/16/2013 [facility] Patient Information 2015 [place], [place]. This information is not intended to replace advice given to you by your health care provider. Make sure you discuss any questions you have with your health care provider. Exercise to Stay Healthy Exercise helps you become and stay healthy. EXERCISE IDEAS AND TIPS Choose exercises that:  You enjoy.  Fit into your day. You do not need to exercise really hard to be healthy. You can do exercises at a slow or medium level and stay healthy. You can:  Stretch before and after working out.  Try yoga, Pilates, or tai chi.  Lift weights.  Walk fast, swim, jog, run, climb stairs, bicycle, dance, or rollerskate.  Take aerobic classes. Exercises that burn about 150 calories:  Running 1  miles in 15 minutes.  Playing volleyball for 45 to 60 minutes.  Washing and waxing a car for 45 to 60 minutes.  Playing touch football for 45 minutes.  Walking 1  miles in 35 minutes.  Pushing a stroller 1  miles in 30 minutes.  Playing basketball for 30 minutes.  Raking leaves for 30 minutes.  Bicycling 5 miles in 30 minutes.  Walking 2 miles in 30 minutes.  Dancing for 30 minutes.  Shoveling snow for 15 minutes.  Swimming laps for 20 minutes.  Walking up stairs for 15 minutes.  Bicycling 4 miles in 15 minutes.  Gardening for 30 to 45 minutes.  Jumping rope for 15 minutes.  Washing windows or floors for 45 to 60 minutes. Document Released: 08/17/2010 Document Revised: 10/07/2011 Document Reviewed: 08/17/2010 [facility] Patient Information 2015 [place], [place]. This information is not intended to replace advice given to you by your health care provider. Make sure you discuss any questions you have with your health care provider. Hypertension Hypertension, commonly called high blood pressure, is  when the force of blood pumping through your arteries is too strong. Your arteries  are the blood vessels that carry blood from your heart throughout your body. A blood pressure reading consists of a higher number over a lower number, such as 110/72. The higher number (systolic) is the pressure inside your arteries when your heart pumps. The lower number (diastolic) is the pressure inside your arteries when your heart relaxes. Ideally you want your blood pressure below 120/80. Hypertension forces your heart to work harder to pump blood. Your arteries may become narrow or stiff. Having hypertension puts you at risk for heart disease, stroke, and other problems.  RISK FACTORS Some risk factors for high blood pressure are controllable. Others are not.  Risk factors you cannot control include:   Race. You may be at higher risk if you are African American.  Age. Risk increases with age.  Gender. Men are at higher risk than women before age 25 years. After age 10, women are at higher risk than men. Risk factors you can control include:  Not getting enough exercise or physical activity.  Being overweight.  Getting too much fat, sugar, calories, or salt in your diet.  Drinking too much alcohol. SIGNS AND SYMPTOMS Hypertension does not usually cause signs or symptoms. Extremely high blood pressure (hypertensive crisis) may cause headache, anxiety, shortness of breath, and nosebleed. DIAGNOSIS  To check if you have hypertension, your health care provider will measure your blood pressure while you are seated, with your arm held at the level of your heart. It should be measured at least twice using the same arm. Certain conditions can cause a difference in blood pressure between your right and left arms. A blood pressure reading that is higher than normal on one occasion does not mean that you need treatment. If one blood pressure reading is high, ask your health care provider about having it checked again. TREATMENT  Treating high blood pressure includes making lifestyle changes and  possibly taking medicine. Living a healthy lifestyle can help lower high blood pressure. You may need to change some of your habits. Lifestyle changes may include:  Following the DASH diet. This diet is high in fruits, vegetables, and whole grains. It is low in salt, red meat, and added sugars.  Getting at least 2 hours of brisk physical activity every week.  Losing weight if necessary.  Not smoking.  Limiting alcoholic beverages.  Learning ways to reduce stress. If lifestyle changes are not enough to get your blood pressure under control, your health care provider may prescribe medicine. You may need to take more than one. Work closely with your health care provider to understand the risks and benefits. HOME CARE INSTRUCTIONS  Have your blood pressure rechecked as directed by your health care provider.   Take medicines only as directed by your health care provider. Follow the directions carefully. Blood pressure medicines must be taken as prescribed. The medicine does not work as well when you skip doses. Skipping doses also puts you at risk for problems.   Do not smoke.   Monitor your blood pressure at home as directed by your health care provider. SEEK MEDICAL CARE IF:   You think you are having a reaction to medicines taken.  You have recurrent headaches or feel dizzy.  You have swelling in your ankles.  You have trouble with your vision. SEEK IMMEDIATE MEDICAL CARE IF:  You develop a severe headache or confusion.  You have unusual weakness, numbness, or  feel faint.  You have severe chest or abdominal pain.  You vomit repeatedly.  You have trouble breathing. MAKE SURE YOU:   Understand these instructions.  Will watch your condition.  Will get help right away if you are not doing well or get worse. Document Released: 07/15/2005 Document Revised: 11/29/2013 Document Reviewed: 05/07/2013 [facility] Patient Information 2015 [place], [place]. This information is  not intended to replace advice given to you by your health care provider. Make sure you discuss any questions you have with your health care provider.

## 2015-03-01 NOTE — Progress Notes (Signed)
Samayah Novinger 20-Feb-1986 [id number]    History:    Presents for annual exam.  Regular monthly cycle. History of for SABs, unknown cause. Currently not sexually active, long term partner  currently incarcerated, in therapy and drug free. Questions if his drug abuse, stressful environment may have contributed to last 2 SABs. Normal Pap history. 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   Past medical history, past surgical history, family history and social history were all reviewed and documented in the EPIC chart. Works from home for [employer]. Father diabetes and hypertension.  ROS:  A ROS was performed and pertinent positives and negatives are included.  Exam:  Filed Vitals:   03/01/15 0911  BP: 164/92    General appearance:  Normal Thyroid:  Symmetrical, normal in size, without palpable masses or nodularity. Respiratory  Auscultation:  Clear without wheezing or rhonchi Cardiovascular  Auscultation:  Regular rate, without rubs, murmurs or gallops  Edema/varicosities:  Not grossly evident Abdominal  Soft,nontender, without masses, guarding or rebound.  Liver/spleen:  No organomegaly noted  Hernia:  None appreciated  Skin  Inspection:  Grossly normal   Breasts: Examined lying and sitting.     Right: Without masses, retractions, discharge or axillary adenopathy.     Left: Without masses, retractions, discharge or axillary adenopathy. Gentitourinary   Inguinal/mons:  Normal without inguinal adenopathy  External genitalia:  Normal  BUS/Urethra/Skene's glands:  Normal  Vagina:  Normal  Cervix:  Normal  Uterus:   normal in size, shape and contour.  Midline and mobile  Adnexa/parametria:     Rt: Without masses or tenderness.   Lt: Without masses or tenderness.  Anus and perineum: Normal  Digital rectal exam: Normal sphincter tone without palpated masses or tenderness  Assessment/Plan:  29 y.o. SBF G5 P0 for annual exam.      Monthly cycle/not sexually active Habitual aborter Hanoch-Schonlein syndrome-remission Probable hypertension  Plan: Reviewed importance of continuing counseling for self and  partner after  partner out of jail in October. Reviewed/encouraged fertility management, genetic testing declines at this time. SBE's, exercise, healthy lifestyle, healthy pregnancy behaviors, prenatal vitamin daily encouraged. CBC, CMP, lipid panel, TSH, UA, Pap with HR HPV typing, new screening guidelines reviewed. Reviewed blood pressure elevated, reviewed importance of follow-up with primary care for management, had been on HCTZ in the past. Instructed to printout lab results and take to appointment, check blood pressure several times prior to appointment.    Huel Cote WHNP, 5:50 PM 03/01/2015

## 2015-03-02 LAB — CYTOLOGY - PAP

## 2015-03-17 ENCOUNTER — Telehealth: Payer: Self-pay

## 2015-03-17 NOTE — Telephone Encounter (Signed)
You are correct, she is negative for the high-risk 16, 18 and 45 which can cause more problems with cervical cancer. She is able to conceive but does not carry pregnancy

## 2015-03-17 NOTE — Telephone Encounter (Signed)
-----   Message from Huel Cote, NP sent at 03/15/2015  7:16 AM EDT ----- Please call and review Pap was normal, positive for HR HPV but negative for  16, 18 and 45 which causes most of the problems. Repeat Pap in one year.

## 2015-03-17 NOTE — Telephone Encounter (Signed)
When I told patient about her result she asked could this HPV virus cause miscarriage. I told her no it would not.  (Just wanted to double check that info with you.)

## 2015-05-16 ENCOUNTER — Other Ambulatory Visit: Payer: Self-pay | Admitting: Women's Health

## 2015-05-16 DIAGNOSIS — E7800 Pure hypercholesterolemia, unspecified: Secondary | ICD-10-CM

## 2015-05-16 DIAGNOSIS — E782 Mixed hyperlipidemia: Secondary | ICD-10-CM

## 2015-08-11 ENCOUNTER — Ambulatory Visit: Payer: 59 | Admitting: Women's Health

## 2015-12-28 ENCOUNTER — Ambulatory Visit: Payer: 59 | Admitting: Gynecology

## 2016-12-11 ENCOUNTER — Encounter: Payer: Self-pay | Admitting: Gynecology

## 2017-03-10 ENCOUNTER — Emergency Department (HOSPITAL_COMMUNITY)
Admission: EM | Admit: 2017-03-10 | Discharge: 2017-03-10 | Disposition: A | Discharge status: Home / Self Care | Payer: 59 | Attending: Emergency Medicine | Admitting: Emergency Medicine

## 2017-03-10 ENCOUNTER — Encounter (HOSPITAL_COMMUNITY): Payer: Self-pay | Admitting: Emergency Medicine

## 2017-03-10 DIAGNOSIS — I10 Essential (primary) hypertension: Secondary | ICD-10-CM | POA: Insufficient documentation

## 2017-03-10 DIAGNOSIS — Z5321 Procedure and treatment not carried out due to patient leaving prior to being seen by health care provider: Secondary | ICD-10-CM | POA: Insufficient documentation

## 2017-03-10 NOTE — ED Notes (Signed)
Called pt for room placement no response. 

## 2017-03-10 NOTE — ED Triage Notes (Signed)
Pt states she has had a slight headache and took her BP this morning and it was 173V systolic. States that she used to be on HCTZ but was taken off of it. Alert and oriented. Neuro intact.

## 2018-02-09 ENCOUNTER — Emergency Department (HOSPITAL_COMMUNITY)
Admission: EM | Admit: 2018-02-09 | Discharge: 2018-02-09 | Disposition: A | Discharge status: Home / Self Care | Payer: 59 | Attending: Emergency Medicine | Admitting: Emergency Medicine

## 2018-02-09 ENCOUNTER — Encounter (HOSPITAL_COMMUNITY): Payer: Self-pay | Admitting: Emergency Medicine

## 2018-02-09 DIAGNOSIS — Z79899 Other long term (current) drug therapy: Secondary | ICD-10-CM | POA: Diagnosis not present

## 2018-02-09 DIAGNOSIS — H9201 Otalgia, right ear: Secondary | ICD-10-CM | POA: Diagnosis present

## 2018-02-09 DIAGNOSIS — F17210 Nicotine dependence, cigarettes, uncomplicated: Secondary | ICD-10-CM | POA: Insufficient documentation

## 2018-02-09 DIAGNOSIS — H6981 Other specified disorders of Eustachian tube, right ear: Secondary | ICD-10-CM | POA: Diagnosis not present

## 2018-02-09 MED ORDER — FLUTICASONE PROPIONATE 50 MCG/ACT NA SUSP: 2.0000 | Freq: Every day | NASAL | 0 refills | Status: DC

## 2018-02-09 MED ORDER — LORATADINE 10 MG PO TABS: 10.0000 mg | ORAL_TABLET | Freq: Every day | ORAL | 0 refills | Status: DC

## 2018-02-09 NOTE — ED Notes (Signed)
Pt states she has not taken her bp meds today and will take them upon getting home

## 2018-02-09 NOTE — Discharge Instructions (Signed)
You do not have anything in your ear. You probably feel like you have something in there because there is some fluid behind the ear drum, which can happen due to eustachian tube dysfunction. Continue to stay well-hydrated. DO NOT PUT ANYTHING IN YOUR EAR! Continue to alternate between Tylenol and Ibuprofen for pain or fever. Use flonase and the netipot as directed to help with symptoms. Take antihistamine Claritin as directed to help with symptoms. Follow up with the ENT doctor in 1-2 weeks for recheck of ongoing symptoms. Return to emergency department for emergent changing or worsening of symptoms.

## 2018-02-09 NOTE — ED Triage Notes (Signed)
Patient reports right ear felt "clogged" yesterday and today noticed she was missing the earring from her right ear. Reports sticking tweezers in ear and feeling earring but unable to pull it out.

## 2018-02-09 NOTE — ED Provider Notes (Signed)
[facility] DEPT Provider Note   CSN: [id number] Arrival date & time: 02/09/18  1523     History   Chief Complaint Chief Complaint  Patient presents with  . Foreign Body in [place] is a 32 y.o. female with a PMHx of HSP and HTN, who presents to the ED with complaints of clogged feeling in right ear since yesterday.  Patient states that she felt like she could hear as well out of her right ear, felt like it was clogged or like there is something in there.  She used tweezers to try to remove whatever was potentially lodged in there, and she thought she felt something.  Today she noticed that her hearing was missing, states that it was the stud portion of the earring, not the backing.  She thinks that her hearing is in her ear.  She has used peroxide with no relief of her symptoms, no known aggravating factors.  She denies any ear pain or drainage, ear bleeding, URI symptoms, rhinorrhea/congestion, fevers, chills, or any other complaints at this time.  The history is provided by the patient and medical records. No language interpreter was used.  Foreign Body in [place]     Past Medical History:  Diagnosis Date  . FH: multiple miscarriages or stillbirths   . 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 Hypertension 08/2008   NO MEDS  . Type O blood, Rh negative    O-  . Urinary tract infection     Patient Active Problem List   Diagnosis Date Noted  . Recurrent pregnancy loss without current pregnancy 01/14/2013    Past Surgical History:  Procedure Laterality Date  . abortions    . WISDOM TOOTH EXTRACTION       OB History    Gravida  5   Para      Term      Preterm      AB  5   Living  0     SAB  4   TAB  1   Ectopic      Multiple      Live Births               Home Medications    Prior to Admission medications   Medication Sig Start Date End Date Taking? Authorizing Provider  acetaminophen (TYLENOL) 325 MG  tablet Take 650 mg by mouth every 6 (six) hours as needed for mild pain.   Yes [provider]  hydrochlorothiazide (HYDRODIURIL) 25 MG tablet Take 25 mg by mouth daily. 01/08/18   [provider]    Family History Family History  Problem Relation Age of Onset  . Cancer Maternal Grandmother        THROAT CA  . Diabetes Father   . Hypertension Father   . Hypertension Paternal Grandmother     Social History Social History   Tobacco Use  . Smoking status: Current Some Day Smoker    Packs/day: 0.25    Years: 3.00    Pack years: 0.75    Types: Cigarettes  Substance Use Topics  . Alcohol use: Yes    Alcohol/week: 0.0 oz  . Drug use: Yes    Types: Marijuana     Allergies   Patient has no known allergies.   Review of Systems Review of Systems  Constitutional: Negative for chills and fever.  HENT: Positive for hearing loss. Negative for ear discharge, ear pain, rhinorrhea and sore  throat.   Allergic/Immunologic: Negative for immunocompromised state.     Physical Exam Updated Vital Signs BP (!) 167/99 (BP Location: Right Arm)   Pulse 85   Temp 99.1 F (37.3 C) (Oral)   Resp 18   SpO2 97%   Physical Exam  Constitutional: She is oriented to person, place, and time. Vital signs are normal. She appears well-developed and well-nourished.  Non-toxic appearance. No distress.  Afebrile, nontoxic, NAD  HENT:  Head: Normocephalic and atraumatic.  Right Ear: Hearing, external ear and ear canal normal. No drainage, swelling or tenderness. No foreign bodies. Tympanic membrane is injected. Tympanic membrane is not scarred, not perforated, not erythematous and not bulging. A middle ear effusion (serous) is present.  Left Ear: Hearing, tympanic membrane, external ear and ear canal normal. No foreign bodies. Tympanic membrane is not injected, not erythematous and not bulging.  No middle ear effusion.  Nose: Mucosal edema present.  Mouth/Throat: Mucous membranes are  normal.  R ear canal without swelling or drainage, no abrasions or wounds, no foreign bodies noted, TM with minimal injection at the superior edge but nowhere else, no erythema, scant serous effusion but no purulent effusion, no loss of landmarks, no bulging, no TM perforation or abrasions.  L ear clear.  Nose mildly congested.   Eyes: Conjunctivae and EOM are normal. Right eye exhibits no discharge. Left eye exhibits no discharge.  Neck: Normal range of motion. Neck supple.  Cardiovascular: Normal rate and intact distal pulses.  Pulmonary/Chest: Effort normal. No respiratory distress.  Abdominal: Normal appearance. She exhibits no distension.  Musculoskeletal: Normal range of motion.  Neurological: She is alert and oriented to person, place, and time. She has normal strength. No sensory deficit.  Skin: Skin is warm, dry and intact. No rash noted.  Psychiatric: She has a normal mood and affect. Her behavior is normal.  Nursing note and vitals reviewed.    ED Treatments / Results  Labs (all labs ordered are listed, but only abnormal results are displayed) Labs Reviewed - No data to display  EKG None  Radiology No results found.  Procedures Procedures (including critical care time)  Medications Ordered in ED Medications - No data to display   Initial Impression / Assessment and Plan / ED Course  I have reviewed the triage vital signs and the nursing notes.  Pertinent labs & imaging results that were available during my care of the patient were reviewed by me and considered in my medical decision making (see chart for details).     32 y.o. female here with clogged feeling in R ear, thought she had an earring in her ear. On exam, R ear TM slightly injected on superior aspect but no erythema or injection elsewhere, mild serous effusion noted but no evidence of AOM, canal clear and without abrasions, foreign bodies, or swelling/drainage. Nose mildly congested. I suspect eustachian  tube dysfunction, doubt AOM/AOE. Will start on flonase and claritin, advised use of OTC remedies for symptomatic relief and nasal rinses, and f/up with ENT in 1-2wks for recheck. Advised not using anything in her ears such as q-tips/tweezers/etc. I explained the diagnosis and have given explicit precautions to return to the ER including for any other new or worsening symptoms. The patient understands and accepts the medical plan as it's been dictated and I have answered their questions. Discharge instructions concerning home care and prescriptions have been given. The patient is STABLE and is discharged to home in good condition.    Final Clinical  Impressions(s) / ED Diagnoses   Final diagnoses:  Dysfunction of right eustachian tube    ED Discharge Orders        Ordered    fluticasone (FLONASE) 50 MCG/ACT nasal spray  Daily     02/09/18 1710    loratadine (CLARITIN) 10 MG tablet  Daily     02/09/18 [address] [place], [place] 02/09/18 1731    Tegeler, Gwenyth Allegra, MD 02/10/18 [phone]

## 2018-12-28 DEATH — deceased

## 2020-05-11 ENCOUNTER — Other Ambulatory Visit: Payer: Self-pay | Admitting: Obstetrics and Gynecology

## 2020-05-11 DIAGNOSIS — Z3200 Encounter for pregnancy test, result unknown: Secondary | ICD-10-CM

## 2020-05-25 LAB — OB RESULTS CONSOLE RPR: RPR: NONREACTIVE

## 2020-05-25 LAB — OB RESULTS CONSOLE ANTIBODY SCREEN: Antibody Screen: NEGATIVE

## 2020-05-25 LAB — OB RESULTS CONSOLE GC/CHLAMYDIA
Chlamydia: NEGATIVE
Gonorrhea: NEGATIVE

## 2020-05-25 LAB — OB RESULTS CONSOLE ABO/RH: RH Type: NEGATIVE

## 2020-05-25 LAB — OB RESULTS CONSOLE HIV ANTIBODY (ROUTINE TESTING): HIV: NONREACTIVE

## 2020-05-25 LAB — OB RESULTS CONSOLE RUBELLA ANTIBODY, IGM: Rubella: IMMUNE

## 2020-05-25 LAB — OB RESULTS CONSOLE HEPATITIS B SURFACE ANTIGEN: Hepatitis B Surface Ag: NEGATIVE

## 2020-07-29 NOTE — L&D Delivery Note (Addendum)
Delivery Note Labor onset: 12/17/2020  Labor Onset Time: 1200 Complete dilation at 9:17 AM  Onset of pushing at 0920 FHR second stage Cat 1 Analgesia/Anesthesia intrapartum: Epidural  Guided pushing with maternal urge. Delivery of a viable female at 29. Fetal head delivered in direct OP position.  Nuchal cord: x1, loose, reduced on perineum.  Infant placed on maternal abd, dried, and tactile stim.  Cord double clamped after 30 sec and cut by Cyndi Bender, pt's mother. Luke, FOB, also present for birth NICU called for thick meconium and poor respiratory effort.   Cord blood sample collected: Yes Arterial cord blood sample collected: Yes-arterial pH 7.249  Placenta delivered Schultz, intact, with 3 VC.  Placenta to path for [facility]. Uterine tone form, bleeding small  No laceration identified.  Anesthesia: N/A  Repair: N/A EBL (mL): 546 Complications: none APGAR: APGAR (1 MIN): 3  APGAR (5 MINS): 7   APGAR (10 MINS): 8   Mom to postpartum.  Baby to Couplet care / Skin to Skin.  Baby Boy "Suzanne Bullock"  Breastfeeding Desires in-pt circ.  Arrie Eastern MSN, CNM 12/18/2020, 12:00 PM

## 2020-08-21 ENCOUNTER — Other Ambulatory Visit: Payer: Self-pay | Admitting: Obstetrics and Gynecology

## 2020-08-21 DIAGNOSIS — Z363 Encounter for antenatal screening for malformations: Secondary | ICD-10-CM

## 2020-08-25 ENCOUNTER — Encounter: Payer: Self-pay | Admitting: *Deleted

## 2020-08-28 ENCOUNTER — Ambulatory Visit: Payer: No Typology Code available for payment source | Admitting: *Deleted

## 2020-08-28 ENCOUNTER — Encounter: Payer: Self-pay | Admitting: *Deleted

## 2020-08-28 ENCOUNTER — Ambulatory Visit: Payer: No Typology Code available for payment source | Attending: Obstetrics and Gynecology

## 2020-08-28 ENCOUNTER — Other Ambulatory Visit: Payer: Self-pay

## 2020-08-28 VITALS — BP 134/72 | HR 109

## 2020-08-28 DIAGNOSIS — O09529 Supervision of elderly multigravida, unspecified trimester: Secondary | ICD-10-CM

## 2020-08-28 DIAGNOSIS — Z363 Encounter for antenatal screening for malformations: Secondary | ICD-10-CM | POA: Insufficient documentation

## 2020-08-29 ENCOUNTER — Other Ambulatory Visit: Payer: Self-pay | Admitting: *Deleted

## 2020-08-29 DIAGNOSIS — O10912 Unspecified pre-existing hypertension complicating pregnancy, second trimester: Secondary | ICD-10-CM

## 2020-09-25 ENCOUNTER — Ambulatory Visit: Payer: No Typology Code available for payment source | Admitting: *Deleted

## 2020-09-25 ENCOUNTER — Other Ambulatory Visit: Payer: Self-pay

## 2020-09-25 ENCOUNTER — Ambulatory Visit: Payer: No Typology Code available for payment source | Attending: Obstetrics

## 2020-09-25 VITALS — BP 138/78 | HR 103

## 2020-09-25 DIAGNOSIS — O09522 Supervision of elderly multigravida, second trimester: Secondary | ICD-10-CM | POA: Diagnosis not present

## 2020-09-25 DIAGNOSIS — O10919 Unspecified pre-existing hypertension complicating pregnancy, unspecified trimester: Secondary | ICD-10-CM | POA: Insufficient documentation

## 2020-09-25 DIAGNOSIS — Z3A26 26 weeks gestation of pregnancy: Secondary | ICD-10-CM

## 2020-09-25 DIAGNOSIS — O10912 Unspecified pre-existing hypertension complicating pregnancy, second trimester: Secondary | ICD-10-CM | POA: Diagnosis not present

## 2020-09-25 DIAGNOSIS — D259 Leiomyoma of uterus, unspecified: Secondary | ICD-10-CM

## 2020-09-25 DIAGNOSIS — O3412 Maternal care for benign tumor of corpus uteri, second trimester: Secondary | ICD-10-CM | POA: Diagnosis not present

## 2020-09-25 DIAGNOSIS — O10012 Pre-existing essential hypertension complicating pregnancy, second trimester: Secondary | ICD-10-CM | POA: Diagnosis not present

## 2020-09-25 DIAGNOSIS — O9912 Other diseases of the blood and blood-forming organs and certain disorders involving the immune mechanism complicating childbirth: Secondary | ICD-10-CM

## 2020-09-25 DIAGNOSIS — D649 Anemia, unspecified: Secondary | ICD-10-CM

## 2020-09-25 DIAGNOSIS — O2622 Pregnancy care for patient with recurrent pregnancy loss, second trimester: Secondary | ICD-10-CM

## 2020-09-25 DIAGNOSIS — O358XX0 Maternal care for other (suspected) fetal abnormality and damage, not applicable or unspecified: Secondary | ICD-10-CM

## 2020-09-25 DIAGNOSIS — Z148 Genetic carrier of other disease: Secondary | ICD-10-CM

## 2020-09-26 ENCOUNTER — Other Ambulatory Visit: Payer: Self-pay | Admitting: Obstetrics

## 2020-09-26 DIAGNOSIS — O3413 Maternal care for benign tumor of corpus uteri, third trimester: Secondary | ICD-10-CM

## 2020-09-26 DIAGNOSIS — O09523 Supervision of elderly multigravida, third trimester: Secondary | ICD-10-CM

## 2020-09-26 DIAGNOSIS — D259 Leiomyoma of uterus, unspecified: Secondary | ICD-10-CM

## 2020-09-26 DIAGNOSIS — Z3A30 30 weeks gestation of pregnancy: Secondary | ICD-10-CM

## 2020-09-26 DIAGNOSIS — O10919 Unspecified pre-existing hypertension complicating pregnancy, unspecified trimester: Secondary | ICD-10-CM

## 2020-10-23 ENCOUNTER — Ambulatory Visit: Payer: No Typology Code available for payment source | Attending: Obstetrics

## 2020-10-23 ENCOUNTER — Other Ambulatory Visit: Payer: Self-pay

## 2020-10-23 ENCOUNTER — Encounter: Payer: Self-pay | Admitting: *Deleted

## 2020-10-23 ENCOUNTER — Ambulatory Visit: Payer: No Typology Code available for payment source | Admitting: *Deleted

## 2020-10-23 VITALS — BP 147/83 | HR 101

## 2020-10-23 DIAGNOSIS — O2623 Pregnancy care for patient with recurrent pregnancy loss, third trimester: Secondary | ICD-10-CM

## 2020-10-23 DIAGNOSIS — O10013 Pre-existing essential hypertension complicating pregnancy, third trimester: Secondary | ICD-10-CM | POA: Diagnosis not present

## 2020-10-23 DIAGNOSIS — D259 Leiomyoma of uterus, unspecified: Secondary | ICD-10-CM | POA: Insufficient documentation

## 2020-10-23 DIAGNOSIS — I10 Essential (primary) hypertension: Secondary | ICD-10-CM | POA: Diagnosis present

## 2020-10-23 DIAGNOSIS — O09523 Supervision of elderly multigravida, third trimester: Secondary | ICD-10-CM | POA: Diagnosis present

## 2020-10-23 DIAGNOSIS — O99013 Anemia complicating pregnancy, third trimester: Secondary | ICD-10-CM

## 2020-10-23 DIAGNOSIS — O3413 Maternal care for benign tumor of corpus uteri, third trimester: Secondary | ICD-10-CM | POA: Diagnosis present

## 2020-10-23 DIAGNOSIS — Z362 Encounter for other antenatal screening follow-up: Secondary | ICD-10-CM | POA: Diagnosis not present

## 2020-10-23 DIAGNOSIS — O10919 Unspecified pre-existing hypertension complicating pregnancy, unspecified trimester: Secondary | ICD-10-CM | POA: Insufficient documentation

## 2020-10-23 DIAGNOSIS — D649 Anemia, unspecified: Secondary | ICD-10-CM

## 2020-10-23 DIAGNOSIS — Z3A30 30 weeks gestation of pregnancy: Secondary | ICD-10-CM | POA: Diagnosis present

## 2020-10-23 DIAGNOSIS — Z148 Genetic carrier of other disease: Secondary | ICD-10-CM

## 2020-10-24 ENCOUNTER — Other Ambulatory Visit: Payer: Self-pay | Admitting: *Deleted

## 2020-10-24 DIAGNOSIS — O10913 Unspecified pre-existing hypertension complicating pregnancy, third trimester: Secondary | ICD-10-CM

## 2020-11-06 ENCOUNTER — Encounter (HOSPITAL_COMMUNITY): Payer: Self-pay | Admitting: Obstetrics and Gynecology

## 2020-11-06 ENCOUNTER — Ambulatory Visit (HOSPITAL_BASED_OUTPATIENT_CLINIC_OR_DEPARTMENT_OTHER): Payer: No Typology Code available for payment source | Admitting: *Deleted

## 2020-11-06 ENCOUNTER — Encounter: Payer: Self-pay | Admitting: *Deleted

## 2020-11-06 ENCOUNTER — Other Ambulatory Visit: Payer: Self-pay

## 2020-11-06 ENCOUNTER — Ambulatory Visit: Payer: No Typology Code available for payment source | Admitting: *Deleted

## 2020-11-06 ENCOUNTER — Inpatient Hospital Stay (HOSPITAL_COMMUNITY)
Admission: AD | Admit: 2020-11-06 | Discharge: 2020-11-06 | Disposition: A | Discharge status: Home / Self Care | Payer: No Typology Code available for payment source | Attending: Obstetrics and Gynecology | Admitting: Obstetrics and Gynecology

## 2020-11-06 VITALS — BP 161/87

## 2020-11-06 VITALS — BP 152/87 | HR 103

## 2020-11-06 DIAGNOSIS — Z3A32 32 weeks gestation of pregnancy: Secondary | ICD-10-CM | POA: Insufficient documentation

## 2020-11-06 DIAGNOSIS — O10913 Unspecified pre-existing hypertension complicating pregnancy, third trimester: Secondary | ICD-10-CM | POA: Insufficient documentation

## 2020-11-06 DIAGNOSIS — I10 Essential (primary) hypertension: Secondary | ICD-10-CM

## 2020-11-06 DIAGNOSIS — O09523 Supervision of elderly multigravida, third trimester: Secondary | ICD-10-CM | POA: Diagnosis not present

## 2020-11-06 DIAGNOSIS — Z79899 Other long term (current) drug therapy: Secondary | ICD-10-CM | POA: Insufficient documentation

## 2020-11-06 DIAGNOSIS — Z3689 Encounter for other specified antenatal screening: Secondary | ICD-10-CM

## 2020-11-06 DIAGNOSIS — O10919 Unspecified pre-existing hypertension complicating pregnancy, unspecified trimester: Secondary | ICD-10-CM

## 2020-11-06 DIAGNOSIS — Z87891 Personal history of nicotine dependence: Secondary | ICD-10-CM | POA: Diagnosis not present

## 2020-11-06 LAB — COMPREHENSIVE METABOLIC PANEL
ALT: 22 U/L (ref 0–44)
AST: 18 U/L (ref 15–41)
Albumin: 3.1 g/dL — ABNORMAL LOW (ref 3.5–5.0)
Alkaline Phosphatase: 152 U/L — ABNORMAL HIGH (ref 38–126)
Anion gap: 7 (ref 5–15)
BUN: 6 mg/dL (ref 6–20)
CO2: 19 mmol/L — ABNORMAL LOW (ref 22–32)
Calcium: 9.9 mg/dL (ref 8.9–10.3)
Chloride: 107 mmol/L (ref 98–111)
Creatinine, Ser: 0.62 mg/dL (ref 0.44–1.00)
GFR, Estimated: >60 mL/min (ref >60)
Glucose, Bld: 69 mg/dL — ABNORMAL LOW (ref 70–99)
Potassium: 3.9 mmol/L (ref 3.5–5.1)
Sodium: 133 mmol/L — ABNORMAL LOW (ref 135–145)
Total Bilirubin: 0.3 mg/dL (ref 0.3–1.2)
Total Protein: 7.3 g/dL (ref 6.5–8.1)

## 2020-11-06 LAB — CBC
HCT: 34.1 % — ABNORMAL LOW (ref 36.0–46.0)
Hemoglobin: 11 g/dL — ABNORMAL LOW (ref 12.0–15.0)
MCH: 29.3 pg (ref 26.0–34.0)
MCHC: 32.3 g/dL (ref 30.0–36.0)
MCV: 90.9 fL (ref 80.0–100.0)
Platelets: 320 10*3/uL (ref 150–400)
RBC: 3.75 MIL/uL — ABNORMAL LOW (ref 3.87–5.11)
RDW: 14.6 % (ref 11.5–15.5)
WBC: 12 10*3/uL — ABNORMAL HIGH (ref 4.0–10.5)
nRBC: 0 % (ref 0.0–0.2)

## 2020-11-06 LAB — PROTEIN / CREATININE RATIO, URINE
Creatinine, Urine: 106.87 mg/dL
Protein Creatinine Ratio: 0.13 mg/mg{Cre} (ref 0.00–0.15)
Total Protein, Urine: 14 mg/dL

## 2020-11-06 MED ORDER — LABETALOL HCL 5 MG/ML IV SOLN
20.0000 mg | Freq: Once | INTRAVENOUS | Status: AC
  Administered 2020-11-06: 20 mg via INTRAVENOUS

## 2020-11-06 MED ORDER — LABETALOL HCL 200 MG PO TABS: 200.0000 mg | ORAL_TABLET | Freq: Three times a day (TID) | ORAL | 0 refills | Status: DC

## 2020-11-06 MED ORDER — LABETALOL HCL 100 MG PO TABS: 200.0000 mg | ORAL_TABLET | Freq: Once | ORAL | Status: DC

## 2020-11-06 MED ORDER — LABETALOL HCL 5 MG/ML IV SOLN
INTRAVENOUS | Status: AC
  Filled 2020-11-06: qty 4

## 2020-11-06 NOTE — MAU Provider Note (Signed)
History     CSN: [id number]  Arrival date and time: 11/06/20 1636   Event Date/Time   First Provider Initiated Contact with Patient 11/06/20 1728      Chief Complaint  Patient presents with  . Hypertension   HPI This is a 35yo G5P0040 at [redacted]w[redacted]d with h/o Chronic hypertension, RH neg. She was sent over from MFM office due to elevated BP. Patient states that her blood pressure has been labile over the weekend and has been getting BPs in the 150-160 range. She has been compliant with her medication, without too much improvement.   OB History    Gravida  5   Para      Term      Preterm      AB  4   Living  0     SAB  3   IAB  1   Ectopic      Multiple      Live Births              Past Medical History:  Diagnosis Date  . FH: multiple miscarriages or stillbirths   . 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 Hypertension 08/2008   NO MEDS  . Type O blood, Rh negative    O-  . Urinary tract infection     Past Surgical History:  Procedure Laterality Date  . abortions    . WISDOM TOOTH EXTRACTION      Family History  Problem Relation Age of Onset  . Cancer Maternal Grandmother        THROAT CA  . Diabetes Father   . Hypertension Father   . Hypertension Paternal Grandmother     Social History   Tobacco Use  . Smoking status: Former Smoker    Packs/day: 0.25    Years: 3.00    Pack years: 0.75    Types: Cigarettes  . Smokeless tobacco: Never Used  Vaping Use  . Vaping Use: Never used  Substance Use Topics  . Alcohol use: Not Currently    Alcohol/week: 0.0 standard drinks  . Drug use: Not Currently    Types: Marijuana    Allergies: No Known Allergies  Medications Prior to Admission  Medication Sig Dispense Refill Last Dose  . acetaminophen (TYLENOL) 325 MG tablet Take 650 mg by mouth every 6 (six) hours as needed for mild pain.     Marland Kitchen LABETALOL HCL PO Take 100 mg by mouth 2 (two) times daily.     Marland Kitchen NIFEDIPINE PO Take 30 mg by mouth daily.      . Prenatal Vit-Fe Fumarate-FA (MULTIVITAMIN-PRENATAL) 27-0.8 MG TABS tablet Take 1 tablet by mouth daily at 12 noon.       Review of Systems Physical Exam   Patient Vitals for the past 24 hrs:  BP Temp Temp src Pulse Resp SpO2 Height Weight  11/06/20 1915 (!) 157/97 -- -- 92 -- -- -- --  11/06/20 1900 (!) 151/102 -- -- 99 -- -- -- --  11/06/20 1845 (!) 143/86 -- -- 94 -- -- -- --  11/06/20 1830 (!) 146/94 -- -- 93 -- -- -- --  11/06/20 1815 (!) 159/103 -- -- 93 -- -- -- --  11/06/20 1800 (!) 152/96 -- -- 93 -- -- -- --  11/06/20 1745 (!) 167/94 -- -- 97 -- -- -- --  11/06/20 1732 (!) 154/98 -- -- (!) 104 -- -- -- --  11/06/20 1713 (!) 173/101 97.9 F (36.6 C) Oral 92 18 100 %  5\' 4"  (1.626 m) 86.5 kg     Blood pressure (!) 157/97, pulse 92, temperature 97.9 F (36.6 C), temperature source Oral, resp. rate 18, height 5\' 4"  (1.626 m), weight 86.5 kg, last menstrual period 03/26/2020, SpO2 100 %.  Physical Exam Vitals and nursing note reviewed.  Constitutional:      Appearance: Normal appearance.  HENT:     Head: Normocephalic and atraumatic.  Cardiovascular:     Rate and Rhythm: Normal rate and regular rhythm.     Pulses: Normal pulses.     Heart sounds: Normal heart sounds.  Pulmonary:     Effort: Pulmonary effort is normal.     Breath sounds: Normal breath sounds.  Abdominal:     General: Abdomen is flat.     Palpations: Abdomen is soft.  Skin:    General: Skin is warm.     Capillary Refill: Capillary refill takes less than 2 seconds.  Neurological:     General: No focal deficit present.     Mental Status: She is alert.  Psychiatric:        Mood and Affect: Mood normal.        Behavior: Behavior normal.        Thought Content: Thought content normal.        Judgment: Judgment normal.    Results for orders placed or performed during the hospital encounter of 11/06/20 (from the past 24 hour(s))  Comprehensive metabolic panel     Status: Abnormal   Collection  Time: 11/06/20  5:20 PM  Result Value Ref Range   Sodium 133 (L) 135 - 145 mmol/L   Potassium 3.9 3.5 - 5.1 mmol/L   Chloride 107 98 - 111 mmol/L   CO2 19 (L) 22 - 32 mmol/L   Glucose, Bld 69 (L) 70 - 99 mg/dL   BUN 6 6 - 20 mg/dL   Creatinine, Ser 0.62 0.44 - 1.00 mg/dL   Calcium 9.9 8.9 - 10.3 mg/dL   Total Protein 7.3 6.5 - 8.1 g/dL   Albumin 3.1 (L) 3.5 - 5.0 g/dL   AST 18 15 - 41 U/L   ALT 22 0 - 44 U/L   Alkaline Phosphatase 152 (H) 38 - 126 U/L   Total Bilirubin 0.3 0.3 - 1.2 mg/dL   GFR, Estimated >60 >60 mL/min   Anion gap 7 5 - 15  CBC     Status: Abnormal   Collection Time: 11/06/20  5:20 PM  Result Value Ref Range   WBC 12.0 (H) 4.0 - 10.5 K/uL   RBC 3.75 (L) 3.87 - 5.11 MIL/uL   Hemoglobin 11.0 (L) 12.0 - 15.0 g/dL   HCT 34.1 (L) 36.0 - 46.0 %   MCV 90.9 80.0 - 100.0 fL   MCH 29.3 26.0 - 34.0 pg   MCHC 32.3 30.0 - 36.0 g/dL   RDW 14.6 11.5 - 15.5 %   Platelets 320 150 - 400 K/uL   nRBC 0.0 0.0 - 0.2 %  Protein / creatinine ratio, urine     Status: None   Collection Time: 11/06/20  5:26 PM  Result Value Ref Range   Creatinine, Urine 106.87 mg/dL   Total Protein, Urine 14 mg/dL   Protein Creatinine Ratio 0.13 0.00 - 0.15 mg/mg[Cre]     MAU Course  Procedures NST: 140, mod variability, 10x10. No decel  MDM Received 1 dose of IV labetalol, then stable afterwards. Increase labetalol to 200mg  TID.  Assessment and Plan   1. [redacted] weeks  gestation of pregnancy   2. Chronic hypertension   3. NST (non-stress test) reactive    Increase labetalol. D/c to home.  Truett Mainland 11/06/2020, 7:55 PM

## 2020-11-06 NOTE — MAU Note (Signed)
Sent over from MFM, because her BP was a little bit high.  Sent over to get a little lab work and make sure everything is ok.  Denies HA, visual changes, epigastric pain or increase in swelling.  Denies any pain , bleeding  Or leaking. Hx of HTN in the past, no meds the past 4 yrs until now.

## 2020-11-06 NOTE — Procedures (Addendum)
Suzanne Bullock January 14, 1986 [redacted]w[redacted]d  Fetus A Non-Stress Test Interpretation for 11/06/20  Indication: Chronic Hypertenstion and AMA  Fetal Heart Rate A Mode: External Baseline Rate (A): 135 bpm Variability: Moderate Accelerations: 15 x 15 Decelerations: None Multiple birth?: No  Uterine Activity Mode: Palpation,Toco Resting Tone Palpated: Relaxed Resting Time: Adequate  Interpretation (Fetal Testing) Nonstress Test Interpretation: Reactive Comments: Dr. Gertie Exon reviewed tracing.

## 2020-11-06 NOTE — Progress Notes (Signed)
Patient referred to MAU for further eval of BP. Dr. Gertie Exon gave report to Dr. Nehemiah Settle.

## 2020-11-06 NOTE — Discharge Instructions (Signed)
Increase labetalol to 200mg  TID. Return with headache, right upper abdominal pain.

## 2020-11-13 ENCOUNTER — Encounter: Payer: Self-pay | Admitting: *Deleted

## 2020-11-13 ENCOUNTER — Ambulatory Visit: Payer: No Typology Code available for payment source | Admitting: *Deleted

## 2020-11-13 ENCOUNTER — Ambulatory Visit: Payer: No Typology Code available for payment source | Attending: Obstetrics and Gynecology

## 2020-11-13 ENCOUNTER — Other Ambulatory Visit: Payer: Self-pay

## 2020-11-13 VITALS — BP 146/85 | HR 91

## 2020-11-13 DIAGNOSIS — Z362 Encounter for other antenatal screening follow-up: Secondary | ICD-10-CM | POA: Diagnosis not present

## 2020-11-13 DIAGNOSIS — D649 Anemia, unspecified: Secondary | ICD-10-CM

## 2020-11-13 DIAGNOSIS — O10013 Pre-existing essential hypertension complicating pregnancy, third trimester: Secondary | ICD-10-CM | POA: Diagnosis not present

## 2020-11-13 DIAGNOSIS — I10 Essential (primary) hypertension: Secondary | ICD-10-CM

## 2020-11-13 DIAGNOSIS — O10913 Unspecified pre-existing hypertension complicating pregnancy, third trimester: Secondary | ICD-10-CM | POA: Insufficient documentation

## 2020-11-13 DIAGNOSIS — O3413 Maternal care for benign tumor of corpus uteri, third trimester: Secondary | ICD-10-CM

## 2020-11-13 DIAGNOSIS — O2623 Pregnancy care for patient with recurrent pregnancy loss, third trimester: Secondary | ICD-10-CM

## 2020-11-13 DIAGNOSIS — D259 Leiomyoma of uterus, unspecified: Secondary | ICD-10-CM

## 2020-11-13 DIAGNOSIS — O99013 Anemia complicating pregnancy, third trimester: Secondary | ICD-10-CM

## 2020-11-13 DIAGNOSIS — O09523 Supervision of elderly multigravida, third trimester: Secondary | ICD-10-CM

## 2020-11-13 DIAGNOSIS — Z3A33 33 weeks gestation of pregnancy: Secondary | ICD-10-CM

## 2020-11-14 ENCOUNTER — Other Ambulatory Visit: Payer: Self-pay | Admitting: *Deleted

## 2020-11-14 DIAGNOSIS — O10013 Pre-existing essential hypertension complicating pregnancy, third trimester: Secondary | ICD-10-CM | POA: Diagnosis not present

## 2020-11-14 DIAGNOSIS — Z3A32 32 weeks gestation of pregnancy: Secondary | ICD-10-CM | POA: Diagnosis not present

## 2020-11-14 DIAGNOSIS — O10913 Unspecified pre-existing hypertension complicating pregnancy, third trimester: Secondary | ICD-10-CM

## 2020-11-14 DIAGNOSIS — O09523 Supervision of elderly multigravida, third trimester: Secondary | ICD-10-CM

## 2020-11-21 ENCOUNTER — Ambulatory Visit: Payer: No Typology Code available for payment source | Attending: Obstetrics and Gynecology

## 2020-11-21 ENCOUNTER — Other Ambulatory Visit: Payer: Self-pay

## 2020-11-21 ENCOUNTER — Ambulatory Visit: Payer: No Typology Code available for payment source | Admitting: *Deleted

## 2020-11-21 ENCOUNTER — Encounter: Payer: Self-pay | Admitting: *Deleted

## 2020-11-21 VITALS — BP 148/77 | HR 89

## 2020-11-21 DIAGNOSIS — D259 Leiomyoma of uterus, unspecified: Secondary | ICD-10-CM | POA: Diagnosis not present

## 2020-11-21 DIAGNOSIS — I10 Essential (primary) hypertension: Secondary | ICD-10-CM

## 2020-11-21 DIAGNOSIS — Z362 Encounter for other antenatal screening follow-up: Secondary | ICD-10-CM | POA: Diagnosis not present

## 2020-11-21 DIAGNOSIS — O10913 Unspecified pre-existing hypertension complicating pregnancy, third trimester: Secondary | ICD-10-CM | POA: Insufficient documentation

## 2020-11-21 DIAGNOSIS — O10013 Pre-existing essential hypertension complicating pregnancy, third trimester: Secondary | ICD-10-CM

## 2020-11-21 DIAGNOSIS — D649 Anemia, unspecified: Secondary | ICD-10-CM

## 2020-11-21 DIAGNOSIS — O09523 Supervision of elderly multigravida, third trimester: Secondary | ICD-10-CM

## 2020-11-21 DIAGNOSIS — O3413 Maternal care for benign tumor of corpus uteri, third trimester: Secondary | ICD-10-CM | POA: Diagnosis not present

## 2020-11-21 DIAGNOSIS — O99013 Anemia complicating pregnancy, third trimester: Secondary | ICD-10-CM

## 2020-11-21 DIAGNOSIS — O2623 Pregnancy care for patient with recurrent pregnancy loss, third trimester: Secondary | ICD-10-CM

## 2020-11-21 DIAGNOSIS — Z3A34 34 weeks gestation of pregnancy: Secondary | ICD-10-CM

## 2020-11-22 ENCOUNTER — Other Ambulatory Visit: Payer: Self-pay | Admitting: *Deleted

## 2020-11-22 DIAGNOSIS — O10913 Unspecified pre-existing hypertension complicating pregnancy, third trimester: Secondary | ICD-10-CM

## 2020-11-27 ENCOUNTER — Ambulatory Visit: Payer: No Typology Code available for payment source | Admitting: *Deleted

## 2020-11-27 ENCOUNTER — Encounter: Payer: Self-pay | Admitting: *Deleted

## 2020-11-27 ENCOUNTER — Other Ambulatory Visit: Payer: Self-pay

## 2020-11-27 ENCOUNTER — Ambulatory Visit: Payer: No Typology Code available for payment source | Attending: Obstetrics and Gynecology

## 2020-11-27 VITALS — BP 145/87 | HR 89

## 2020-11-27 DIAGNOSIS — Z362 Encounter for other antenatal screening follow-up: Secondary | ICD-10-CM

## 2020-11-27 DIAGNOSIS — D259 Leiomyoma of uterus, unspecified: Secondary | ICD-10-CM | POA: Diagnosis not present

## 2020-11-27 DIAGNOSIS — O3413 Maternal care for benign tumor of corpus uteri, third trimester: Secondary | ICD-10-CM | POA: Diagnosis not present

## 2020-11-27 DIAGNOSIS — O09523 Supervision of elderly multigravida, third trimester: Secondary | ICD-10-CM | POA: Diagnosis present

## 2020-11-27 DIAGNOSIS — Z3A35 35 weeks gestation of pregnancy: Secondary | ICD-10-CM

## 2020-11-27 DIAGNOSIS — O10913 Unspecified pre-existing hypertension complicating pregnancy, third trimester: Secondary | ICD-10-CM | POA: Diagnosis not present

## 2020-11-27 DIAGNOSIS — O2623 Pregnancy care for patient with recurrent pregnancy loss, third trimester: Secondary | ICD-10-CM

## 2020-11-27 DIAGNOSIS — O10013 Pre-existing essential hypertension complicating pregnancy, third trimester: Secondary | ICD-10-CM | POA: Diagnosis not present

## 2020-11-27 DIAGNOSIS — D649 Anemia, unspecified: Secondary | ICD-10-CM

## 2020-11-27 DIAGNOSIS — O99013 Anemia complicating pregnancy, third trimester: Secondary | ICD-10-CM

## 2020-12-04 ENCOUNTER — Ambulatory Visit: Payer: No Typology Code available for payment source | Admitting: *Deleted

## 2020-12-04 ENCOUNTER — Other Ambulatory Visit: Payer: Self-pay

## 2020-12-04 ENCOUNTER — Ambulatory Visit: Payer: No Typology Code available for payment source | Attending: Obstetrics and Gynecology

## 2020-12-04 ENCOUNTER — Other Ambulatory Visit: Payer: Self-pay | Admitting: Family Medicine

## 2020-12-04 ENCOUNTER — Encounter: Payer: Self-pay | Admitting: *Deleted

## 2020-12-04 VITALS — BP 155/89 | HR 90

## 2020-12-04 DIAGNOSIS — O3413 Maternal care for benign tumor of corpus uteri, third trimester: Secondary | ICD-10-CM

## 2020-12-04 DIAGNOSIS — D259 Leiomyoma of uterus, unspecified: Secondary | ICD-10-CM

## 2020-12-04 DIAGNOSIS — Z3A36 36 weeks gestation of pregnancy: Secondary | ICD-10-CM

## 2020-12-04 DIAGNOSIS — O99013 Anemia complicating pregnancy, third trimester: Secondary | ICD-10-CM

## 2020-12-04 DIAGNOSIS — D649 Anemia, unspecified: Secondary | ICD-10-CM

## 2020-12-04 DIAGNOSIS — O09523 Supervision of elderly multigravida, third trimester: Secondary | ICD-10-CM | POA: Diagnosis not present

## 2020-12-04 DIAGNOSIS — Z362 Encounter for other antenatal screening follow-up: Secondary | ICD-10-CM | POA: Diagnosis not present

## 2020-12-04 DIAGNOSIS — I10 Essential (primary) hypertension: Secondary | ICD-10-CM | POA: Insufficient documentation

## 2020-12-04 DIAGNOSIS — O10913 Unspecified pre-existing hypertension complicating pregnancy, third trimester: Secondary | ICD-10-CM | POA: Diagnosis present

## 2020-12-04 DIAGNOSIS — O2623 Pregnancy care for patient with recurrent pregnancy loss, third trimester: Secondary | ICD-10-CM

## 2020-12-04 DIAGNOSIS — O10013 Pre-existing essential hypertension complicating pregnancy, third trimester: Secondary | ICD-10-CM

## 2020-12-04 LAB — OB RESULTS CONSOLE GBS: GBS: POSITIVE

## 2020-12-05 ENCOUNTER — Telehealth (HOSPITAL_COMMUNITY): Payer: Self-pay | Admitting: *Deleted

## 2020-12-05 ENCOUNTER — Other Ambulatory Visit: Payer: Self-pay | Admitting: Obstetrics & Gynecology

## 2020-12-05 NOTE — Telephone Encounter (Signed)
Preadmission screen  

## 2020-12-06 ENCOUNTER — Telehealth (HOSPITAL_COMMUNITY): Payer: Self-pay | Admitting: *Deleted

## 2020-12-06 ENCOUNTER — Encounter (HOSPITAL_COMMUNITY): Payer: Self-pay | Admitting: *Deleted

## 2020-12-06 NOTE — Telephone Encounter (Signed)
Preadmission screen  

## 2020-12-11 ENCOUNTER — Other Ambulatory Visit: Payer: Self-pay

## 2020-12-11 ENCOUNTER — Other Ambulatory Visit: Payer: Self-pay | Admitting: Obstetrics and Gynecology

## 2020-12-11 ENCOUNTER — Ambulatory Visit: Payer: No Typology Code available for payment source | Admitting: *Deleted

## 2020-12-11 ENCOUNTER — Ambulatory Visit: Payer: No Typology Code available for payment source | Attending: Obstetrics and Gynecology

## 2020-12-11 VITALS — BP 130/74 | HR 98

## 2020-12-11 DIAGNOSIS — O99013 Anemia complicating pregnancy, third trimester: Secondary | ICD-10-CM

## 2020-12-11 DIAGNOSIS — O09523 Supervision of elderly multigravida, third trimester: Secondary | ICD-10-CM

## 2020-12-11 DIAGNOSIS — O10913 Unspecified pre-existing hypertension complicating pregnancy, third trimester: Secondary | ICD-10-CM | POA: Insufficient documentation

## 2020-12-11 DIAGNOSIS — Z3A37 37 weeks gestation of pregnancy: Secondary | ICD-10-CM

## 2020-12-11 DIAGNOSIS — I10 Essential (primary) hypertension: Secondary | ICD-10-CM | POA: Insufficient documentation

## 2020-12-11 DIAGNOSIS — O10919 Unspecified pre-existing hypertension complicating pregnancy, unspecified trimester: Secondary | ICD-10-CM | POA: Diagnosis present

## 2020-12-11 DIAGNOSIS — O10013 Pre-existing essential hypertension complicating pregnancy, third trimester: Secondary | ICD-10-CM | POA: Diagnosis not present

## 2020-12-11 DIAGNOSIS — O2623 Pregnancy care for patient with recurrent pregnancy loss, third trimester: Secondary | ICD-10-CM

## 2020-12-11 DIAGNOSIS — O3413 Maternal care for benign tumor of corpus uteri, third trimester: Secondary | ICD-10-CM | POA: Diagnosis not present

## 2020-12-11 DIAGNOSIS — D259 Leiomyoma of uterus, unspecified: Secondary | ICD-10-CM | POA: Diagnosis not present

## 2020-12-11 DIAGNOSIS — D649 Anemia, unspecified: Secondary | ICD-10-CM

## 2020-12-11 NOTE — Procedures (Signed)
Suzanne Bullock [birth year] [redacted]w[redacted]d  Fetus A Non-Stress Test Interpretation for 12/11/20  Indication: Chronic Hypertenstion  Fetal Heart Rate A Mode: External Baseline Rate (A): 140 bpm Variability: Moderate Accelerations: 10 x 10,15 x 15 Decelerations: None Multiple birth?: No  Uterine Activity Mode: Palpation,Toco Contraction Frequency (min): UI Contraction Quality: Mild Resting Tone Palpated: Relaxed  Interpretation (Fetal Testing) Comments: Dr. Annamaria Boots reviewed tracing.

## 2020-12-12 ENCOUNTER — Other Ambulatory Visit: Payer: Self-pay | Admitting: Family Medicine

## 2020-12-14 ENCOUNTER — Other Ambulatory Visit: Payer: Self-pay

## 2020-12-14 ENCOUNTER — Ambulatory Visit: Payer: No Typology Code available for payment source

## 2020-12-14 ENCOUNTER — Ambulatory Visit (HOSPITAL_BASED_OUTPATIENT_CLINIC_OR_DEPARTMENT_OTHER): Payer: No Typology Code available for payment source | Admitting: *Deleted

## 2020-12-14 ENCOUNTER — Other Ambulatory Visit (HOSPITAL_COMMUNITY)
Admission: RE | Admit: 2020-12-14 | Discharge: 2020-12-14 | Disposition: A | Discharge status: Home / Self Care | Payer: No Typology Code available for payment source | Source: Ambulatory Visit | Attending: Obstetrics & Gynecology | Admitting: Obstetrics & Gynecology

## 2020-12-14 ENCOUNTER — Ambulatory Visit: Payer: No Typology Code available for payment source | Admitting: *Deleted

## 2020-12-14 VITALS — BP 162/87 | HR 92

## 2020-12-14 DIAGNOSIS — Z3A37 37 weeks gestation of pregnancy: Secondary | ICD-10-CM | POA: Insufficient documentation

## 2020-12-14 DIAGNOSIS — O10913 Unspecified pre-existing hypertension complicating pregnancy, third trimester: Secondary | ICD-10-CM | POA: Insufficient documentation

## 2020-12-14 DIAGNOSIS — Z01812 Encounter for preprocedural laboratory examination: Secondary | ICD-10-CM | POA: Insufficient documentation

## 2020-12-14 DIAGNOSIS — I10 Essential (primary) hypertension: Secondary | ICD-10-CM

## 2020-12-14 DIAGNOSIS — Z20822 Contact with and (suspected) exposure to covid-19: Secondary | ICD-10-CM | POA: Insufficient documentation

## 2020-12-14 DIAGNOSIS — O10919 Unspecified pre-existing hypertension complicating pregnancy, unspecified trimester: Secondary | ICD-10-CM

## 2020-12-14 LAB — SARS CORONAVIRUS 2 (TAT 6-24 HRS): SARS Coronavirus 2: NEGATIVE

## 2020-12-14 NOTE — Procedures (Signed)
Suzanne Bullock 03-13-1986 [redacted]w[redacted]d  Fetus A Non-Stress Test Interpretation for 12/14/20  Indication: Chronic Hypertenstion  Fetal Heart Rate A Mode: External Baseline Rate (A): 130 bpm Variability: Moderate Accelerations: 15 x 15 Decelerations: None Multiple birth?: No  Uterine Activity Mode: Palpation,Toco Contraction Frequency (min): Occas Contraction Quality: Mild Resting Tone Palpated: Relaxed Resting Time: Adequate  Interpretation (Fetal Testing) Nonstress Test Interpretation: Reactive Comments: Dr. Annamaria Boots reviewed tracing.

## 2020-12-15 ENCOUNTER — Other Ambulatory Visit (HOSPITAL_COMMUNITY): Payer: No Typology Code available for payment source

## 2020-12-16 ENCOUNTER — Other Ambulatory Visit: Payer: Self-pay

## 2020-12-17 ENCOUNTER — Inpatient Hospital Stay (HOSPITAL_COMMUNITY): Payer: No Typology Code available for payment source | Admitting: Anesthesiology

## 2020-12-17 ENCOUNTER — Inpatient Hospital Stay (HOSPITAL_COMMUNITY)
Admission: AD | Admit: 2020-12-17 | Discharge: 2020-12-20 | DRG: 807 | Disposition: A | Discharge status: Home / Self Care | Payer: No Typology Code available for payment source | Attending: Obstetrics & Gynecology | Admitting: Obstetrics & Gynecology

## 2020-12-17 ENCOUNTER — Other Ambulatory Visit: Payer: Self-pay

## 2020-12-17 ENCOUNTER — Inpatient Hospital Stay (HOSPITAL_COMMUNITY): Payer: No Typology Code available for payment source

## 2020-12-17 ENCOUNTER — Encounter (HOSPITAL_COMMUNITY): Payer: Self-pay | Admitting: Obstetrics & Gynecology

## 2020-12-17 DIAGNOSIS — N96 Recurrent pregnancy loss: Secondary | ICD-10-CM | POA: Diagnosis not present

## 2020-12-17 DIAGNOSIS — Z87891 Personal history of nicotine dependence: Secondary | ICD-10-CM | POA: Diagnosis not present

## 2020-12-17 DIAGNOSIS — O1002 Pre-existing essential hypertension complicating childbirth: Secondary | ICD-10-CM | POA: Diagnosis present

## 2020-12-17 DIAGNOSIS — Z79899 Other long term (current) drug therapy: Secondary | ICD-10-CM

## 2020-12-17 DIAGNOSIS — Z6791 Unspecified blood type, Rh negative: Secondary | ICD-10-CM | POA: Diagnosis not present

## 2020-12-17 DIAGNOSIS — O3413 Maternal care for benign tumor of corpus uteri, third trimester: Secondary | ICD-10-CM | POA: Diagnosis present

## 2020-12-17 DIAGNOSIS — O26893 Other specified pregnancy related conditions, third trimester: Secondary | ICD-10-CM | POA: Diagnosis present

## 2020-12-17 DIAGNOSIS — Z20822 Contact with and (suspected) exposure to covid-19: Secondary | ICD-10-CM | POA: Diagnosis present

## 2020-12-17 DIAGNOSIS — D219 Benign neoplasm of connective and other soft tissue, unspecified: Secondary | ICD-10-CM | POA: Diagnosis present

## 2020-12-17 DIAGNOSIS — O09513 Supervision of elderly primigravida, third trimester: Secondary | ICD-10-CM | POA: Diagnosis present

## 2020-12-17 DIAGNOSIS — Z3A38 38 weeks gestation of pregnancy: Secondary | ICD-10-CM | POA: Diagnosis not present

## 2020-12-17 DIAGNOSIS — D259 Leiomyoma of uterus, unspecified: Secondary | ICD-10-CM | POA: Diagnosis present

## 2020-12-17 DIAGNOSIS — I10 Essential (primary) hypertension: Secondary | ICD-10-CM | POA: Diagnosis present

## 2020-12-17 DIAGNOSIS — O99824 Streptococcus B carrier state complicating childbirth: Secondary | ICD-10-CM | POA: Diagnosis present

## 2020-12-17 DIAGNOSIS — O26899 Other specified pregnancy related conditions, unspecified trimester: Secondary | ICD-10-CM

## 2020-12-17 LAB — COMPREHENSIVE METABOLIC PANEL
ALT: 19 U/L (ref 0–44)
AST: 18 U/L (ref 15–41)
Albumin: 3.1 g/dL — ABNORMAL LOW (ref 3.5–5.0)
Alkaline Phosphatase: 195 U/L — ABNORMAL HIGH (ref 38–126)
Anion gap: 9 (ref 5–15)
BUN: 11 mg/dL (ref 6–20)
CO2: 19 mmol/L — ABNORMAL LOW (ref 22–32)
Calcium: 9.6 mg/dL (ref 8.9–10.3)
Chloride: 106 mmol/L (ref 98–111)
Creatinine, Ser: 0.87 mg/dL (ref 0.44–1.00)
GFR, Estimated: >60 mL/min (ref >60)
Glucose, Bld: 87 mg/dL (ref 70–99)
Potassium: 4 mmol/L (ref 3.5–5.1)
Sodium: 134 mmol/L — ABNORMAL LOW (ref 135–145)
Total Bilirubin: 0.4 mg/dL (ref 0.3–1.2)
Total Protein: 7.5 g/dL (ref 6.5–8.1)

## 2020-12-17 LAB — CBC
HCT: 35.3 % — ABNORMAL LOW (ref 36.0–46.0)
HCT: 36.9 % (ref 36.0–46.0)
Hemoglobin: 11.6 g/dL — ABNORMAL LOW (ref 12.0–15.0)
Hemoglobin: 12 g/dL (ref 12.0–15.0)
MCH: 29.4 pg (ref 26.0–34.0)
MCH: 29.2 pg (ref 26.0–34.0)
MCHC: 32.9 g/dL (ref 30.0–36.0)
MCHC: 32.5 g/dL (ref 30.0–36.0)
MCV: 89.4 fL (ref 80.0–100.0)
MCV: 89.8 fL (ref 80.0–100.0)
Platelets: 286 10*3/uL (ref 150–400)
Platelets: 284 10*3/uL (ref 150–400)
RBC: 3.95 MIL/uL (ref 3.87–5.11)
RBC: 4.11 MIL/uL (ref 3.87–5.11)
RDW: 14.6 % (ref 11.5–15.5)
RDW: 14.5 % (ref 11.5–15.5)
WBC: 10.8 10*3/uL — ABNORMAL HIGH (ref 4.0–10.5)
WBC: 10.2 10*3/uL (ref 4.0–10.5)
nRBC: 0 % (ref 0.0–0.2)
nRBC: 0 % (ref 0.0–0.2)

## 2020-12-17 LAB — RPR: RPR Ser Ql: NONREACTIVE

## 2020-12-17 LAB — TYPE AND SCREEN
ABO/RH(D): O NEG
Antibody Screen: NEGATIVE

## 2020-12-17 LAB — PROTEIN / CREATININE RATIO, URINE
Creatinine, Urine: 65.34 mg/dL
Protein Creatinine Ratio: 0.21 mg/mg{Cre} — ABNORMAL HIGH (ref 0.00–0.15)
Total Protein, Urine: 14 mg/dL

## 2020-12-17 MED ORDER — TERBUTALINE SULFATE 1 MG/ML IJ SOLN: 0.2500 mg | Freq: Once | INTRAMUSCULAR | Status: DC | PRN

## 2020-12-17 MED ORDER — SODIUM CHLORIDE 0.9 % IV SOLN
5.0000 10*6.[IU] | Freq: Once | INTRAVENOUS | Status: AC
  Administered 2020-12-17: 5 10*6.[IU] via INTRAVENOUS
  Filled 2020-12-17: qty 5

## 2020-12-17 MED ORDER — LACTATED RINGERS IV SOLN: 500.0000 mL | INTRAVENOUS | Status: DC | PRN

## 2020-12-17 MED ORDER — FENTANYL-BUPIVACAINE-NACL 0.5-0.125-0.9 MG/250ML-% EP SOLN
12.0000 mL/h | EPIDURAL | Status: DC | PRN
  Administered 2020-12-17 – 2020-12-18 (×2): 12 mL/h via EPIDURAL
  Filled 2020-12-17 (×2): qty 250

## 2020-12-17 MED ORDER — LABETALOL HCL 200 MG PO TABS
200.0000 mg | ORAL_TABLET | Freq: Three times a day (TID) | ORAL | Status: DC
  Administered 2020-12-17 (×2): 200 mg via ORAL
  Filled 2020-12-17 (×3): qty 1

## 2020-12-17 MED ORDER — LABETALOL HCL 200 MG PO TABS
300.0000 mg | ORAL_TABLET | Freq: Three times a day (TID) | ORAL | Status: DC
  Administered 2020-12-17 – 2020-12-20 (×9): 300 mg via ORAL
  Filled 2020-12-17 (×9): qty 1

## 2020-12-17 MED ORDER — LABETALOL HCL 5 MG/ML IV SOLN
20.0000 mg | INTRAVENOUS | Status: DC | PRN
Start: 2020-12-17 — End: 2020-12-20
  Administered 2020-12-17 – 2020-12-18 (×5): 20 mg via INTRAVENOUS
  Filled 2020-12-17 (×7): qty 4

## 2020-12-17 MED ORDER — NIFEDIPINE ER OSMOTIC RELEASE 30 MG PO TB24
60.0000 mg | ORAL_TABLET | Freq: Every day | ORAL | Status: DC
  Administered 2020-12-17 – 2020-12-18 (×2): 60 mg via ORAL
  Filled 2020-12-17 (×2): qty 1

## 2020-12-17 MED ORDER — FENTANYL CITRATE (PF) 100 MCG/2ML IJ SOLN
100.0000 ug | INTRAMUSCULAR | Status: DC | PRN
  Administered 2020-12-17 (×2): 100 ug via INTRAVENOUS
  Filled 2020-12-17 (×2): qty 2

## 2020-12-17 MED ORDER — FENTANYL CITRATE (PF) 100 MCG/2ML IJ SOLN: 100.0000 ug | Freq: Once | INTRAMUSCULAR | Status: DC

## 2020-12-17 MED ORDER — OXYTOCIN-SODIUM CHLORIDE 30-0.9 UT/500ML-% IV SOLN: 1.0000 m[IU]/min | INTRAVENOUS | Status: DC

## 2020-12-17 MED ORDER — EPHEDRINE 5 MG/ML INJ: 10.0000 mg | INTRAVENOUS | Status: DC | PRN

## 2020-12-17 MED ORDER — ONDANSETRON HCL 4 MG/2ML IJ SOLN
4.0000 mg | Freq: Four times a day (QID) | INTRAMUSCULAR | Status: DC | PRN
  Administered 2020-12-17: 4 mg via INTRAVENOUS
  Filled 2020-12-17: qty 2

## 2020-12-17 MED ORDER — LIDOCAINE HCL (PF) 1 % IJ SOLN
INTRAMUSCULAR | Status: DC | PRN
  Administered 2020-12-17 (×2): 4 mL via EPIDURAL

## 2020-12-17 MED ORDER — SOD CITRATE-CITRIC ACID 500-334 MG/5ML PO SOLN: 30.0000 mL | ORAL | Status: DC | PRN

## 2020-12-17 MED ORDER — PENICILLIN G POT IN DEXTROSE 60000 UNIT/ML IV SOLN
3.0000 10*6.[IU] | INTRAVENOUS | Status: DC
  Administered 2020-12-17 – 2020-12-18 (×7): 3 10*6.[IU] via INTRAVENOUS
  Filled 2020-12-17 (×9): qty 50

## 2020-12-17 MED ORDER — HYDRALAZINE HCL 20 MG/ML IJ SOLN
10.0000 mg | INTRAMUSCULAR | Status: DC | PRN
  Administered 2020-12-17 – 2020-12-18 (×2): 10 mg via INTRAVENOUS
  Filled 2020-12-17 (×3): qty 1

## 2020-12-17 MED ORDER — DIPHENHYDRAMINE HCL 50 MG/ML IJ SOLN: 12.5000 mg | INTRAMUSCULAR | Status: DC | PRN

## 2020-12-17 MED ORDER — PHENYLEPHRINE 40 MCG/ML (10ML) SYRINGE FOR IV PUSH (FOR BLOOD PRESSURE SUPPORT)
80.0000 ug | PREFILLED_SYRINGE | INTRAVENOUS | Status: DC | PRN
  Filled 2020-12-17: qty 10

## 2020-12-17 MED ORDER — OXYTOCIN-SODIUM CHLORIDE 30-0.9 UT/500ML-% IV SOLN
2.5000 [IU]/h | INTRAVENOUS | Status: DC
  Filled 2020-12-17: qty 500

## 2020-12-17 MED ORDER — LABETALOL HCL 5 MG/ML IV SOLN
80.0000 mg | INTRAVENOUS | Status: DC | PRN
  Administered 2020-12-17 – 2020-12-18 (×3): 80 mg via INTRAVENOUS
  Filled 2020-12-17 (×3): qty 16

## 2020-12-17 MED ORDER — LABETALOL HCL 5 MG/ML IV SOLN
40.0000 mg | INTRAVENOUS | Status: DC | PRN
  Administered 2020-12-17 – 2020-12-18 (×4): 40 mg via INTRAVENOUS
  Filled 2020-12-17 (×4): qty 8

## 2020-12-17 MED ORDER — OXYTOCIN-SODIUM CHLORIDE 30-0.9 UT/500ML-% IV SOLN
1.0000 m[IU]/min | INTRAVENOUS | Status: DC
  Administered 2020-12-17: 2 m[IU]/min via INTRAVENOUS
  Filled 2020-12-17: qty 500

## 2020-12-17 MED ORDER — PHENYLEPHRINE 40 MCG/ML (10ML) SYRINGE FOR IV PUSH (FOR BLOOD PRESSURE SUPPORT): 80.0000 ug | PREFILLED_SYRINGE | INTRAVENOUS | Status: DC | PRN

## 2020-12-17 MED ORDER — ACETAMINOPHEN 325 MG PO TABS: 650.0000 mg | ORAL_TABLET | ORAL | Status: DC | PRN

## 2020-12-17 MED ORDER — OXYTOCIN BOLUS FROM INFUSION
333.0000 mL | Freq: Once | INTRAVENOUS | Status: DC
Start: 2020-12-17 — End: 2020-12-18

## 2020-12-17 MED ORDER — LACTATED RINGERS IV SOLN: INTRAVENOUS | Status: DC

## 2020-12-17 MED ORDER — LIDOCAINE HCL (PF) 1 % IJ SOLN: 30.0000 mL | INTRAMUSCULAR | Status: DC | PRN

## 2020-12-17 MED ORDER — LACTATED RINGERS IV SOLN: 500.0000 mL | Freq: Once | INTRAVENOUS | Status: DC

## 2020-12-17 MED ORDER — MISOPROSTOL 25 MCG QUARTER TABLET
25.0000 ug | ORAL_TABLET | ORAL | Status: DC | PRN
  Administered 2020-12-17 (×3): 25 ug via VAGINAL
  Filled 2020-12-17 (×3): qty 1

## 2020-12-17 NOTE — Progress Notes (Signed)
Subjective:    Pt up to BR. Doing well. Starting to feel ctx. SVE 1/50/-2. POC discussed. No questions at present. Cytotec 77mcg placed pv.  Objective:    VS: BP (!) 146/75   Pulse 90   Temp 97.9 F (36.6 C) (Axillary)   Resp 16   Ht 5\' 4"  (1.626 m)   Wt 90.3 kg   LMP 03/26/2020 (Exact Date)   BMI 34.16 kg/m  FHR : baseline 130 / variability moderate / accelerations absent / absent decelerations Toco: contractions every 5-6 minutes / Membranes: intact Dilation: 1 Effacement (%): 50 Cervical Position: Posterior Station: -2 Presentation: Vertex Exam by:: Massie Kluver CNM   Assessment/Plan:   35 y.o. [id number] [redacted]w[redacted]d IOL for CHTN  Labor: Progressing normally. Cytotec x 3 doses Preeclampsia:  no signs or symptoms of toxicity Fetal Wellbeing:  Category I Pain Control:  Epidural and IV pain meds PRN I/D:  Positive. PCN x 2 doses Anticipated MOD:  NSVD  Dr Mancel Bale updated on pt status and [place] MSN, CNM 12/17/2020 9:09 AM

## 2020-12-17 NOTE — Anesthesia Preprocedure Evaluation (Signed)
Anesthesia Evaluation  Patient identified by MRN, date of birth, ID band Patient awake    Reviewed: Allergy & Precautions, Patient's Chart, lab work & pertinent test results, reviewed documented beta blocker date and time   History of Anesthesia Complications Negative for: history of anesthetic complications  Airway Mallampati: II  TM Distance: >3 FB Neck ROM: Full    Dental no notable dental hx.    Pulmonary former smoker,    Pulmonary exam normal        Cardiovascular hypertension, Pt. on medications and Pt. on home beta blockers Normal cardiovascular exam     Neuro/Psych negative neurological ROS  negative psych ROS   GI/Hepatic negative GI ROS, Neg liver ROS,   Endo/Other  negative endocrine ROS  Renal/GU negative Renal ROS  negative genitourinary   Musculoskeletal negative musculoskeletal ROS (+)   Abdominal   Peds  Hematology negative hematology ROS (+)   Anesthesia Other Findings Day of surgery medications reviewed with patient.  Reproductive/Obstetrics (+) Pregnancy                             Anesthesia Physical Anesthesia Plan  ASA: III  Anesthesia Plan: Epidural   Post-op Pain Management:    Induction:   PONV Risk Score and Plan: Treatment may vary due to age or medical condition  Airway Management Planned: Natural Airway  Additional Equipment:   Intra-op Plan:   Post-operative Plan:   Informed Consent: I have reviewed the patients History and Physical, chart, labs and discussed the procedure including the risks, benefits and alternatives for the proposed anesthesia with the patient or authorized representative who has indicated his/her understanding and acceptance.       Plan Discussed with:   Anesthesia Plan Comments:         Anesthesia Quick Evaluation

## 2020-12-17 NOTE — Progress Notes (Signed)
Subjective:    Called to room by RN to verify presentation. BS U/S done. Cephalic.   Objective:    VS: BP (!) 145/78   Pulse 85   Temp 98.9 F (37.2 C) (Oral)   Resp 16   Ht 5\' 4"  (1.626 m)   Wt 90.3 kg   LMP 03/26/2020 (Exact Date)   BMI 34.16 kg/m  FHR : baseline 140 / variability minimal / accelerations 10x10 / absent decelerations Toco: contractions every 3 minutes / Membranes: Intact Dilation: 1 Effacement (%): 50 Cervical Position: Posterior Station: -2 Presentation: Vertex Exam by:: Jaymes Graff RN   Assessment/Plan:   35 y.o. [id number] [redacted]w[redacted]d IOL for CHTN  Labor: Progressing normally Cytotec x 3 doses. Will start pitocin 2x2 at next check if needed and CAT 1 tracing Preeclampsia:  no signs or symptoms of toxicity Fetal Wellbeing:  CAT 2 now d/t minimal variability Pain Control:  Epidural and IV pain meds I/D:  GBS positive - PNC x 3 doses Anticipated MOD:  NSVD  Domingo Pulse MSN, CNM 12/17/2020 1:41 PM

## 2020-12-17 NOTE — Progress Notes (Signed)
Subjective:   Suzanne Bullock is resting comfortably with epidural. SVE 3-4/90/-1. Pitocin infusing. CAT 1 tracing.  Objective:    VS: BP (!) 150/71   Pulse 87   Temp 97.9 F (36.6 C) (Oral)   Resp 16   Ht 5\' 4"  (1.626 m)   Wt 90.3 kg   LMP 03/26/2020 (Exact Date)   BMI 34.16 kg/m  FHR : baseline 140 / variability moderate / accelerations 10x10 / early decelerations Toco: contractions every 3-4 minutes /  Membranes: SROM - clear Dilation: 3.5 Effacement (%): 90 Cervical Position: Posterior Station: -1 Presentation: Vertex Exam by:: Suzanne Bullock CNM Pitocin 6 mU/min  Assessment/Plan:   35 y.o. [id number] [redacted]w[redacted]d IOL for CHTN  Labor: Progressing normally on pitocin. Titrate 2x2 Preeclampsia:  no signs or symptoms of toxicity Fetal Wellbeing:  Category I Pain Control:  Epidural I/D:  GBS positive. PCN x 4 doses Anticipated MOD:  NSVD  Dr Alesia Richards updated on pt status and [place] MSN, CNM 12/17/2020 7:55 PM

## 2020-12-17 NOTE — Anesthesia Procedure Notes (Signed)
Epidural Patient location during procedure: OB Start time: 12/17/2020 3:06 PM End time: 12/17/2020 3:10 PM  Staffing Anesthesiologist: Brennan Bailey, MD Performed: anesthesiologist   Preanesthetic Checklist Completed: patient identified, IV checked, risks and benefits discussed, monitors and equipment checked, pre-op evaluation and timeout performed  Epidural Patient position: sitting Prep: DuraPrep and site prepped and draped Patient monitoring: continuous pulse ox, blood pressure and heart rate Approach: midline Location: L3-L4 Injection technique: LOR air  Needle:  Needle type: Tuohy  Needle gauge: 17 G Needle length: 9 cm Catheter type: closed end flexible Catheter size: 19 Gauge Catheter at skin depth: 10 cm Test dose: negative and Other (1% lidocaine)  Assessment Events: blood not aspirated, injection not painful, no injection resistance, no paresthesia and negative IV test  Additional Notes Patient identified. Risks, benefits, and alternatives discussed with patient including but not limited to bleeding, infection, nerve damage, paralysis, failed block, incomplete pain control, headache, blood pressure changes, nausea, vomiting, reactions to medication, itching, and postpartum back pain. Confirmed with bedside nurse the patient's most recent platelet count. Confirmed with patient that they are not currently taking any anticoagulation, have any bleeding history, or any family history of bleeding disorders. Patient expressed understanding and wished to proceed. All questions were answered. Sterile technique was used throughout the entire procedure. Please see nursing notes for vital signs.   Crisp LOR on first pass. Test dose was given through epidural catheter and negative prior to continuing to dose epidural or start infusion. Warning signs of high block given to the patient including shortness of breath, tingling/numbness in hands, complete motor block, or any concerning  symptoms with instructions to call for help. Patient was given instructions on fall risk and not to get out of bed. All questions and concerns addressed with instructions to call with any issues or inadequate analgesia.  Reason for block:procedure for pain

## 2020-12-17 NOTE — Progress Notes (Signed)
Pt has had severe range BP requiring IV antihypertensives.  Plan discussed w/ Dr. Alesia Richards to treat w/ Hydralazine per protocol while awaiting urine protein creatinine ratio. Will plan for MgSO4 if urine PCR >0.3.   Subjective:    Denies HA, visual changes, or RUQ pain.  Objective:     Today's Vitals   12/17/20 0448 12/17/20 0542 12/17/20 0602 12/17/20 0615  BP: (!) 142/62 (!) 166/83 (!) 163/78 (!) 156/82  Pulse: 89 88 94 96  Resp: 16 15 15 16   Weight:      Height:      PainSc:  Asleep     Body mass index is 34.16 kg/m.  VS: BP (!) 156/82   Pulse 96   Resp 16   Ht 5\' 4"  (1.626 m)   Wt 90.3 kg   LMP 03/26/2020 (Exact Date)   BMI 34.16 kg/m  FHR : baseline 135 / variability moderate/ accelerations present / decelerations absent Toco: contractions irreg Membranes: intact Dilation: Fingertip Effacement (%): Thick Cervical Position: Posterior Station: Ballotable,-3 Presentation: Vertex Exam by:: [place] McHenry RN   Assessment/Plan:   IOL for CHTN 35 y.o. [id number] [redacted]w[redacted]d    Labor:  Cytotec x2, will continue to ripen Preeclampsia:  severe range BP requiring IV Labetalol with mild response. Will administer IV hydralazine, if severe range BP persist, will start MgSO4. Current plan to continue oral antihypertensives per home med schedule and dosing, intake and ouput balanced, labs stable, and protein creatinine pending Fetal Wellbeing:  Category I Pain Control:   per pt's request I/D:   GBS pos, PCN x2 doses Anticipated MOD:  NSVD  Arrie Eastern MSN, CNM 12/17/2020 6:18 AM

## 2020-12-17 NOTE — H&P (Signed)
OB ADMISSION/ HISTORY & PHYSICAL:  Admission Date: 12/17/2020 12:05 AM  Admit Diagnosis: Induction of labor for Chronic hypertension  Suzanne Bullock is a 35 y.o. female [id number] [redacted]w[redacted]d presenting for IOL for CHTN, managed on Labetalol 200 mg PO TID and Procardia 60 mg PO daily . Endorses active FM, denies LOF and vaginal bleeding. Multiple fibroids seen on anatomy U/S (posterior fundal 44. x 3.6 x 3.5; fundal 3.8 x 3.3 x 1.7; anterior 2.5 x 1.4 x 1.2)   History of current pregnancy: G5P0040   Patient entered care with [facility] at 12+2 wks.   EDC by LMP and congruent w/ 8+4 wk U/S.   Anatomy scan:  22+1 wks, complete w/ posterior placenta.   Antenatal testing: for CHTN started at 32 weeks Last evaluation: 37+5 wks, NST @ [facility]-reactive  Significant prenatal events:  Patient Active Problem List   Diagnosis Date Noted  . Chronic hypertension 12/17/2020  . Rh negative, maternal 12/17/2020  . AMA (advanced maternal age) primigravida 33+, third trimester 12/17/2020  . Recurrent pregnancy loss 01/14/2013    Prenatal Labs: ABO, Rh: O/Negative/-- (10/28 0000) Antibody: Negative (10/28 0000) Rubella: Immune (10/28 0000)  RPR: Nonreactive (10/28 0000)  HBsAg: Negative (10/28 0000)  HIV: Non-reactive (10/28 0000)  GTT: passed 1 hr GBS:   positive 12/04/20 GC/CHL: neg/neg Genetics: low-risk Tdap/influenza vaccines: declined both   OB History  Gravida Para Term Preterm AB Living  5       4 0  SAB IAB Ectopic Multiple Live Births  3 1          # Outcome Date GA Lbr Len/2nd Weight Sex Delivery Anes PTL Lv  5 Current           4 SAB           3 SAB           2 SAB           1 IAB             Medical / Surgical History: Past medical history:  Past Medical History:  Diagnosis Date  . FH: multiple miscarriages or stillbirths   . Hypertension 08/2008   NO MEDS  . Pregnancy induced hypertension   . Type O blood, Rh negative    O-  . Urinary tract infection     Past surgical history:   Past Surgical History:  Procedure Laterality Date  . abortions    . WISDOM TOOTH EXTRACTION     Family History:  Family History  Problem Relation Age of Onset  . Hypertension Maternal Grandmother   . Diabetes Father   . Hypertension Father   . Cancer Paternal Grandmother     Social History:  reports that she has quit smoking. Her smoking use included cigarettes. She has a 0.75 pack-year smoking history. She has never used smokeless tobacco. She reports previous alcohol use. She reports previous drug use. Drug: Marijuana.  Allergies: Patient has no known allergies.   Current Medications at time of admission:  Prior to Admission medications   Medication Sig Start Date End Date Taking? Authorizing Provider  acetaminophen (TYLENOL) 325 MG tablet Take 650 mg by mouth every 6 (six) hours as needed for mild pain.    [provider]  labetalol (NORMODYNE) 200 MG tablet TAKE 1 TABLET(200 MG) BY MOUTH THREE TIMES DAILY 12/04/20   Truett Mainland, DO  NIFEDIPINE PO Take 30 mg by mouth daily.    [provider]  Prenatal Vit-Fe  Fumarate-FA (MULTIVITAMIN-PRENATAL) 27-0.8 MG TABS tablet Take 1 tablet by mouth daily at 12 noon.    [provider]    Review of Systems: Constitutional: Negative   HENT: Negative   Eyes: Negative   Respiratory: Negative   Cardiovascular: Negative   Gastrointestinal: Negative  Genitourinary: neg for bloody show, neg for LOF   Musculoskeletal: Negative   Skin: Negative   Neurological: Negative   Endo/Heme/Allergies: Negative   Psychiatric/Behavioral: Negative    Physical Exam: VS: Last menstrual period 03/26/2020. AAO x3, no signs of distress Cardiovascular: RRR Respiratory: Lung fields clear to ausculation GU/GI: Abdomen gravid, non-tender, non-distended, active FM, vertex Extremities: no edema, negative for pain, tenderness, and cords  Cervical exam:  Dilation: Fingertip Effacement (%): Thick Cervical Position:  Posterior Station: Ballotable,-3 Presentation: Vertex Exam by:: [place] McHenry RN  FHR: baseline rate 145 / variability moderate / accelerations present / absent decelerations TOCO: irreg   Prenatal Transfer Tool  Maternal Diabetes: No Genetic Screening: Normal Maternal Ultrasounds/Referrals: Normal Fetal Ultrasounds or other Referrals:  None Maternal Substance Abuse:  No Significant Maternal Medications:  Meds include: Other: Labetalol 200mg  PO TID, Procardia 60 mg PO daily Significant Maternal Lab Results: Group B Strep positive    Assessment: 35 y.o. [id number] [redacted]w[redacted]d IOL for CHTN  FHR category 1 GBS pos Pain management plan: per pt's request   Plan:  Admit to L&D Routine admission orders Epidural PRN Continue Labetalol 200mg  PO TID, Procardia 60 mg PO daily Dr Alesia Richards notified of admission and plan of care  Arrie Eastern MSN, CNM 12/17/2020 12:21 AM

## 2020-12-18 MED ORDER — DIPHENHYDRAMINE HCL 25 MG PO CAPS: 25.0000 mg | ORAL_CAPSULE | Freq: Four times a day (QID) | ORAL | Status: DC | PRN

## 2020-12-18 MED ORDER — IBUPROFEN 600 MG PO TABS
600.0000 mg | ORAL_TABLET | Freq: Four times a day (QID) | ORAL | Status: DC
  Administered 2020-12-18 – 2020-12-20 (×8): 600 mg via ORAL
  Filled 2020-12-18 (×8): qty 1

## 2020-12-18 MED ORDER — ACETAMINOPHEN 325 MG PO TABS
650.0000 mg | ORAL_TABLET | ORAL | Status: DC | PRN
  Administered 2020-12-18: 650 mg via ORAL
  Filled 2020-12-18: qty 2

## 2020-12-18 MED ORDER — NIFEDIPINE ER OSMOTIC RELEASE 30 MG PO TB24
30.0000 mg | ORAL_TABLET | Freq: Once | ORAL | Status: AC
  Administered 2020-12-18: 30 mg via ORAL
  Filled 2020-12-18: qty 1

## 2020-12-18 MED ORDER — NIFEDIPINE ER OSMOTIC RELEASE 30 MG PO TB24
90.0000 mg | ORAL_TABLET | Freq: Every day | ORAL | Status: DC
  Administered 2020-12-19 – 2020-12-20 (×2): 90 mg via ORAL
  Filled 2020-12-18 (×2): qty 3

## 2020-12-18 MED ORDER — ONDANSETRON HCL 4 MG PO TABS: 4.0000 mg | ORAL_TABLET | ORAL | Status: DC | PRN

## 2020-12-18 MED ORDER — PRENATAL MULTIVITAMIN CH
1.0000 | ORAL_TABLET | Freq: Every day | ORAL | Status: DC
  Administered 2020-12-19 – 2020-12-20 (×2): 1 via ORAL
  Filled 2020-12-18 (×2): qty 1

## 2020-12-18 MED ORDER — SENNOSIDES-DOCUSATE SODIUM 8.6-50 MG PO TABS
2.0000 | ORAL_TABLET | ORAL | Status: DC
  Administered 2020-12-19 – 2020-12-20 (×2): 2 via ORAL
  Filled 2020-12-18 (×2): qty 2

## 2020-12-18 MED ORDER — BENZOCAINE-MENTHOL 20-0.5 % EX AERO: 1.0000 "application " | INHALATION_SPRAY | CUTANEOUS | Status: DC | PRN

## 2020-12-18 MED ORDER — WITCH HAZEL-GLYCERIN EX PADS: 1.0000 "application " | MEDICATED_PAD | CUTANEOUS | Status: DC | PRN

## 2020-12-18 MED ORDER — ONDANSETRON HCL 4 MG/2ML IJ SOLN
4.0000 mg | INTRAMUSCULAR | Status: DC | PRN
  Administered 2020-12-18: 4 mg via INTRAVENOUS
  Filled 2020-12-18: qty 2

## 2020-12-18 MED ORDER — DIBUCAINE (PERIANAL) 1 % EX OINT: 1.0000 "application " | TOPICAL_OINTMENT | CUTANEOUS | Status: DC | PRN

## 2020-12-18 MED ORDER — TETANUS-DIPHTH-ACELL PERTUSSIS 5-2.5-18.5 LF-MCG/0.5 IM SUSY: 0.5000 mL | PREFILLED_SYRINGE | Freq: Once | INTRAMUSCULAR | Status: DC

## 2020-12-18 MED ORDER — COCONUT OIL OIL: 1.0000 "application " | TOPICAL_OIL | Status: DC | PRN

## 2020-12-18 MED ORDER — SIMETHICONE 80 MG PO CHEW: 80.0000 mg | CHEWABLE_TABLET | ORAL | Status: DC | PRN

## 2020-12-18 MED ORDER — PANTOPRAZOLE SODIUM 40 MG IV SOLR
40.0000 mg | INTRAVENOUS | Status: DC
  Administered 2020-12-18: 40 mg via INTRAVENOUS
  Filled 2020-12-18: qty 40

## 2020-12-18 NOTE — Lactation Note (Signed)
This note was copied from a baby's chart. Lactation Consultation Note  Patient Name: Suzanne Bullock [id number] Date: 12/18/2020 Reason for consult: Initial assessment;Primapara;1st time breastfeeding;Early term 37-38.6wks Age:37 hours  Initial visit to P1 mother of 41 hours old ET infant currently in [facility] nursery. Mother reports the intention to breastfeed infant. [place] set up DEBP, reinforced the importance of pumping as well as basics/cleaning/frequency. Talked about breast massage, hand expression and unable to express.   Reviewed [employer] booklet and lactation services information with family and encouraged to contact [facility] for support and questions or as soon as baby is back rooming in.     Maternal Data Has patient been taught Hand Expression?: Yes Does the patient have breastfeeding experience prior to this delivery?: No  Feeding Mother's Current Feeding Choice: Breast Milk  Lactation Tools Discussed/Used Tools: Pump;Flanges Flange Size: 24 (may need 84mm soon) Breast pump type: Double-Electric Breast Pump;Manual Pump Education: Setup, frequency, and cleaning;Milk Storage Reason for Pumping: maternal infant separation Pumping frequency: Encouraged Q3 Pumped volume:  (has not started yet)  Interventions Interventions: Breast feeding basics reviewed;Skin to skin;Breast massage;Hand express;DEBP;Hand pump;Expressed milk;Education  Discharge Pump: DEBP;Manual (Plans to get personal DEBP) WIC Program: No  Consult Status Consult Status: Follow-up Date: 12/19/20 Follow-up type: In-patient    Suzanne Bullock 12/18/2020, 4:32 PM

## 2020-12-18 NOTE — Progress Notes (Signed)
Subjective:    Was called for IUPC placement d/t inability to trace ctx. R/B/A discussed with Arlyss. Pt consented for placement. IUPC placed without difficulty.   Objective:    VS: BP (!) 158/82 (BP Location: Left Arm)   Pulse 91   Temp 99.5 F (37.5 C) (Oral)   Resp 18   Ht 5\' 4"  (4.801 m)   Wt 90.3 kg   LMP 03/26/2020 (Exact Date)   BMI 34.16 kg/m  FHR : baseline 135 / variability moderate / accelerations present / early decelerations Toco: contractions every 2-3 minutes  Membranes: SROM Dilation: 7.5 Effacement (%): 80 Cervical Position: Posterior Station: 0 Presentation: Vertex Exam by:: [employer], CNM Pitocin 16 mU/min  Assessment/Plan:   35 y.o. [id number] [redacted]w[redacted]d IOL for CHTN  Labor: Progressing normally on pitocin Preeclampsia:  no signs or symptoms of toxicity Fetal Wellbeing:  Category I Pain Control:  Epidural I/D:  GBS positive. Adequate treatment with PCN. Anticipated MOD:  NSVD  Dr Alesia Richards updated on pt status and [place] MSN, CNM 12/18/2020 6:54 AM

## 2020-12-18 NOTE — Progress Notes (Addendum)
Subjective:    Comfortable w/ epidural and sleeping. Pt's mother and S/O present and supportive.   Objective:    VS: BP (!) 150/80   Pulse 94   Temp 99.6 F (37.6 C) (Oral)   Resp 18   Ht 5\' 4"  (1.626 m)   Wt 90.3 kg   LMP 03/26/2020 (Exact Date)   BMI 34.16 kg/m  FHR : baseline 140 / variability moderate / accelerations present / absent decelerations IUPC: contractions every 2 minutes / MVU 210 Membranes: SROM x12 hrs Dilation: 9 Effacement (%): 100 Station: -1 Presentation: Vertex Exam by: Clois Dupes, CNM Pitocin 16 mU/min  Assessment/Plan:   35 y.o. [id number] [redacted]w[redacted]d IOL for GHTN    -severe range pressures responded to IV Hydralazine, BP have been mild range since oral med doses were increased. Pt to continue on Labetalol 300 mg PO TID and Procardia 60 mg PO daily   Labor: S/P Cytotec x3, progressing on Pitocin Fetal Wellbeing:  Category I Pain Control:  Epidural I/D:  GBS pos, adequate PCN prophylaxis Anticipated MOD:  NSVD  Arrie Eastern MSN, CNM 12/18/2020 7:45 AM

## 2020-12-18 NOTE — Anesthesia Postprocedure Evaluation (Signed)
Anesthesia Post Note  Patient: Psychiatric nurse  Procedure(s) Performed: AN AD HOC LABOR EPIDURAL     Patient location during evaluation: Mother Baby Anesthesia Type: Epidural Level of consciousness: awake and alert Pain management: pain level controlled Vital Signs Assessment: post-procedure vital signs reviewed and stable Respiratory status: spontaneous breathing, nonlabored ventilation and respiratory function stable Cardiovascular status: stable Postop Assessment: no headache, no backache and epidural receding Anesthetic complications: no   No complications documented.  Last Vitals:  Vitals:   12/18/20 1410 12/18/20 1518  BP: (!) 160/88 (!) 160/89  Pulse: 94 94  Resp: 18 20  Temp: 37.4 C 36.7 C  SpO2: 100% 100%    Last Pain:  Vitals:   12/18/20 1611  TempSrc:   PainSc: 4    Pain Goal:                   Suzanne Bullock

## 2020-12-18 NOTE — Lactation Note (Addendum)
This note was copied from a baby's chart. Lactation Consultation Note  Patient Name: Suzanne Bullock [id number] Date: 12/18/2020  Age:37 hours  LC in to room for L&D [facility] consult. Baby is in the warmer and RN is getting ready to do baby care. LC will come back in a few minutes.   Levette Paulick A Higuera Ancidey 12/18/2020, 12:24 PM

## 2020-12-19 LAB — CBC
HCT: 32.2 % — ABNORMAL LOW (ref 36.0–46.0)
Hemoglobin: 10.7 g/dL — ABNORMAL LOW (ref 12.0–15.0)
MCH: 29.6 pg (ref 26.0–34.0)
MCHC: 33.2 g/dL (ref 30.0–36.0)
MCV: 89.2 fL (ref 80.0–100.0)
Platelets: 253 10*3/uL (ref 150–400)
RBC: 3.61 MIL/uL — ABNORMAL LOW (ref 3.87–5.11)
RDW: 14.5 % (ref 11.5–15.5)
WBC: 29.1 10*3/uL — ABNORMAL HIGH (ref 4.0–10.5)
nRBC: 0 % (ref 0.0–0.2)

## 2020-12-19 LAB — COMPREHENSIVE METABOLIC PANEL
ALT: 18 U/L (ref 0–44)
AST: 27 U/L (ref 15–41)
Albumin: 2.6 g/dL — ABNORMAL LOW (ref 3.5–5.0)
Alkaline Phosphatase: 149 U/L — ABNORMAL HIGH (ref 38–126)
Anion gap: 6 (ref 5–15)
BUN: 8 mg/dL (ref 6–20)
CO2: 22 mmol/L (ref 22–32)
Calcium: 9.5 mg/dL (ref 8.9–10.3)
Chloride: 110 mmol/L (ref 98–111)
Creatinine, Ser: 0.81 mg/dL (ref 0.44–1.00)
GFR, Estimated: >60 mL/min (ref >60)
Glucose, Bld: 84 mg/dL (ref 70–99)
Potassium: 3.6 mmol/L (ref 3.5–5.1)
Sodium: 138 mmol/L (ref 135–145)
Total Bilirubin: 0.8 mg/dL (ref 0.3–1.2)
Total Protein: 6.9 g/dL (ref 6.5–8.1)

## 2020-12-19 MED ORDER — PANTOPRAZOLE SODIUM 40 MG PO TBEC
40.0000 mg | DELAYED_RELEASE_TABLET | Freq: Every day | ORAL | Status: DC
  Administered 2020-12-19: 40 mg via ORAL
  Filled 2020-12-19: qty 1

## 2020-12-19 MED ORDER — RHO D IMMUNE GLOBULIN 1500 UNIT/2ML IJ SOSY
300.0000 ug | PREFILLED_SYRINGE | Freq: Once | INTRAMUSCULAR | Status: AC
  Administered 2020-12-19: 300 ug via INTRAVENOUS
  Filled 2020-12-19: qty 2

## 2020-12-19 MED ORDER — RHO D IMMUNE GLOBULIN 1500 UNIT/2ML IJ SOSY
300.0000 ug | PREFILLED_SYRINGE | Freq: Once | INTRAMUSCULAR | Status: DC
  Filled 2020-12-19: qty 2

## 2020-12-19 NOTE — Progress Notes (Signed)
PPD# 1 SVD w/ intact perineum  Information for the patient's newborn:  Alonna, Bartling [[id number]]  female    Baby Name Izola Price Circumcision - desires inpt   S:   Reports feeling "really good" Tolerating PO fluid and solids No nausea or vomiting Bleeding is light Pain controlled with acetaminophen and ibuprofen (OTC) Up ad lib / ambulatory / voiding w/o difficulty Feeding: Breast    O:   VS: BP (!) 150/92 (BP Location: Right Arm)   Pulse 88   Temp 98.2 F (36.8 C) (Oral)   Resp 18   Ht 5\' 4"  (1.626 m)   Wt 90.3 kg   LMP 03/26/2020 (Exact Date)   SpO2 96%   BMI 34.16 kg/m   LABS:  Recent Labs    12/17/20 1400 12/19/20 0547  WBC 10.2 29.1*  HGB 12.0 10.7*  PLT 284 253   Blood type: --/--/O NEG (05/22 0027) Rubella: Immune (10/28 0000)                      I&O: Intake/Output      05/23 0701 05/24 0700 05/24 0701 05/25 0700   Urine (mL/kg/hr) 1600 (0.7)    Blood 250    Total Output 1850    Net -1850         Urine Occurrence 2 x      Physical Exam: Alert and oriented X3 Lungs: Clear and unlabored Heart: regular rate and rhythm / no mumurs Abdomen: soft, non-tender, non-distended  Fundus: firm, non-tender Perineum: intact Lochia: minimal Extremities: negative edema, no calf pain or tenderness    A:  PPD # 1  Normal exam  P:  Routine post partum orders Anticipate D/C on 12/20/20   Plan reviewed w/ Dr. Modena Jansky, MSN, CNM 12/19/2020, 9:44 AM

## 2020-12-19 NOTE — Progress Notes (Signed)
Was called into the room due to patient having a nose bleed.  Upon entering the bleeding had stopped, pt stated she has a history of getting nose bleeds but this time seemed to be more heavier.  Informed pt that due to just delivering and being at the end of pregnancy the vessels can be a little more vascular and could have resulted in increased bleeding at this time.  Pt denies any headaches or visual changes at this time.  Will continue to monitor.

## 2020-12-20 ENCOUNTER — Ambulatory Visit: Payer: Self-pay

## 2020-12-20 ENCOUNTER — Encounter (HOSPITAL_COMMUNITY): Payer: Self-pay | Admitting: *Deleted

## 2020-12-20 DIAGNOSIS — D219 Benign neoplasm of connective and other soft tissue, unspecified: Secondary | ICD-10-CM | POA: Diagnosis present

## 2020-12-20 LAB — RH IG WORKUP (INCLUDES ABO/RH)
Fetal Screen: NEGATIVE
Gestational Age(Wks): 38.1
Unit division: 0

## 2020-12-20 LAB — SURGICAL PATHOLOGY

## 2020-12-20 MED ORDER — LABETALOL HCL 300 MG PO TABS: 300.0000 mg | ORAL_TABLET | Freq: Three times a day (TID) | ORAL | 0 refills | Status: AC

## 2020-12-20 MED ORDER — IBUPROFEN 600 MG PO TABS: 600.0000 mg | ORAL_TABLET | Freq: Four times a day (QID) | ORAL | 0 refills | Status: AC

## 2020-12-20 MED ORDER — NIFEDIPINE ER OSMOTIC RELEASE 90 MG PO TB24: 90.0000 mg | ORAL_TABLET | Freq: Every day | ORAL | 0 refills | Status: AC

## 2020-12-20 NOTE — Discharge Summary (Signed)
SVD OB Discharge Summary     Patient Name: Suzanne Bullock DOB: 12-01-1985 MRN: [id number]  Date of admission: 12/17/2020 Delivering MD: Burman Foster B  Date of delivery: 12/18/2020 Type of delivery: SVD  Newborn Data: Sex: Baby female Circumcision: in pt circ desired prior to discharge Live born female  Birth Weight: 6 lb 8.2 oz (2954 g) APGAR: 3, 6  Newborn Delivery   Birth date/time: 12/18/2020 11:31:00 Delivery type: Vaginal, Spontaneous      Feeding: breast and bottle Infant being discharge to home with mother in stable condition.   Admitting diagnosis: Chronic hypertension [I10] Intrauterine pregnancy: [redacted]w[redacted]d     Secondary diagnosis:  Active Problems:   Chronic hypertension   Rh negative, maternal   AMA (advanced maternal age) primigravida 43+, third trimester   SVD (5/23)   Normal postpartum course   Fibroids                                Complications: None                                                              Intrapartum Procedures: spontaneous vaginal delivery and GBS prophylaxis Postpartum Procedures: Rho(D) Ig Complications-Operative and Postpartum: none Augmentation: Cytotec   History of Present Illness: Ms. Suzanne Bullock is a 35 y.o. female, G5P0040, who presents at [redacted]w[redacted]d weeks gestation. The patient has been followed at  [facility] and Gynecology  Her pregnancy has been complicated by:  Patient Active Problem List   Diagnosis Date Noted  . Normal postpartum course 12/20/2020  . Fibroids 12/20/2020  . SVD (5/23) 12/19/2020  . Chronic hypertension 12/17/2020  . Rh negative, maternal 12/17/2020  . AMA (advanced maternal age) primigravida 68+, third trimester 12/17/2020     Active Ambulatory Problems    Diagnosis Date Noted  . No Active Ambulatory Problems   Resolved Ambulatory Problems    Diagnosis Date Noted  . Recurrent pregnancy loss 01/14/2013   Past Medical History:  Diagnosis Date  . FH: multiple miscarriages or  stillbirths   . Hypertension 08/2008  . Pregnancy induced hypertension   . Type O blood, Rh negative   . Urinary tract infection      Hospital course:  Induction of Labor With Vaginal Delivery   35 y.o. yo G5P0040 at [redacted]w[redacted]d was admitted to the hospital 12/17/2020 for induction of labor.  Indication for induction: CHTN.  Patient had an uncomplicated labor course as follows: Membrane Rupture Time/Date: 7:45 PM ,12/17/2020   Delivery Method:Vaginal, Spontaneous  Episiotomy: None  Lacerations:  None  Details of delivery can be found in separate delivery note.  Patient had a routine postpartum course. Patient is discharged home 12/20/20.  Newborn Data: Birth date:12/18/2020  Birth time:11:31 AM  Gender:Female  Living status:Living  Apgars:3 ,6  [phone] g  Postpartum Day # 3 : S/P NSVD due to pt was admitted on on 5/22 for IOL for CHTN, presented on labetalol 200mg  TID and 60mg  PO XL Procardia daily, during her stay she has required increased to labetalol 300mg  TID PO, and procardia 90mg  XL daily, and will go home on this regimen, pt educated on BP check with [facility] in 4 days and monitor at home, pt  aware of dizzy or HA to check BP prior to taking meds and hold med if BP <120/80, call if BP >160/110. Her admission PCR was 0.21, CMP and CBC WNL, asymptomatic. Pt progress with cytotec to SVD on 12/18/2020 @ 1131 with 8 dose PCN for GBS+, over intact pernioum, eblo was 25mls, hgb drop of 11.6-10.7 asymptomatic.  Newborn in room under billi light currently. BP now 147/84. Pt has h/o fibroid, RH-, received phogam on 5/24, and AMA> . Patient up ad lib, denies syncope or dizziness. Reports consuming regular diet without issues and denies N/V. Patient reports 0 bowel movement + passing flatus.  Denies issues with urination and reports bleeding is "lighter."  Patient is breast and bottle feeding and reports going well.  Desires undecided for postpartum contraception.  Pain is being appropriately managed with use  of po meds.    Physical exam  Vitals:   12/19/20 0845 12/19/20 1427 12/19/20 2009 12/20/20 0651  BP: (!) 150/92 131/71 (!) 147/91 (!) 147/84  Pulse: 88 88 92 94  Resp:   18 18  Temp:   98.1 F (36.7 C) 98.6 F (37 C)  TempSrc:   Oral Oral  SpO2:   100% 100%  Weight:      Height:       General: alert, cooperative and no distress Lochia: appropriate Uterine Fundus: firm Perineum: Intact DVT Evaluation: No evidence of DVT seen on physical exam. Negative Homan's sign. No cords or calf tenderness. No significant calf/ankle edema.  Labs: Lab Results  Component Value Date   WBC 29.1 (H) 12/19/2020   HGB 10.7 (L) 12/19/2020   HCT 32.2 (L) 12/19/2020   MCV 89.2 12/19/2020   PLT 253 12/19/2020   CMP Latest Ref Rng & Units 12/19/2020  Glucose 70 - 99 mg/dL 84  BUN 6 - 20 mg/dL 8  Creatinine 0.44 - 1.00 mg/dL 0.81  Sodium 135 - 145 mmol/L 138  Potassium 3.5 - 5.1 mmol/L 3.6  Chloride 98 - 111 mmol/L 110  CO2 22 - 32 mmol/L 22  Calcium 8.9 - 10.3 mg/dL 9.5  Total Protein 6.5 - 8.1 g/dL 6.9  Total Bilirubin 0.3 - 1.2 mg/dL 0.8  Alkaline Phos 38 - 126 U/L 149(H)  AST 15 - 41 U/L 27  ALT 0 - 44 U/L 18    Date of discharge: 12/20/2020 Discharge Diagnoses: Term Pregnancy-delivered Discharge instruction: per After Visit Summary and "Baby and Me Booklet".  After visit meds:   Activity:           unrestricted and pelvic rest Advance as tolerated. Pelvic rest for 6 weeks.  Diet:                routine Medications: PNV, Ibuprofen and labetlol and procardia Postpartum contraception: Undecided Condition:  Pt discharge to home with baby in stable and condition CHTN: continue lab 300mg  TID and procardia 90mg  xl. F/U in 1 week with BP check.   Meds: Allergies as of 12/20/2020   No Known Allergies     Medication List    STOP taking these medications   NIFEDIPINE PO Replaced by: NIFEdipine 90 MG 24 hr tablet     TAKE these medications   acetaminophen 325 MG tablet Commonly  known as: TYLENOL Take 650 mg by mouth every 6 (six) hours as needed for mild pain.   ibuprofen 600 MG tablet Commonly known as: ADVIL Take 1 tablet (600 mg total) by mouth every 6 (six) hours.   labetalol 300 MG tablet  Commonly known as: NORMODYNE Take 1 tablet (300 mg total) by mouth 3 (three) times daily. What changed:   medication strength  See the new instructions.   multivitamin-prenatal 27-0.8 MG Tabs tablet Take 1 tablet by mouth daily at 12 noon.   NIFEdipine 90 MG 24 hr tablet Commonly known as: PROCARDIA XL/NIFEDICAL-XL Take 1 tablet (90 mg total) by mouth daily. Replaces: NIFEDIPINE PO       Discharge Follow Up:   Follow-up [facility] Follow up.   Specialty: Obstetrics and Gynecology Why: 4 days BP check, 6 weeks PP Contact information: [address] [place] [id number] [place], NP-C, CNM 12/20/2020, 12:01 PM  Noralyn Pick, [place]

## 2020-12-20 NOTE — Lactation Note (Signed)
This note was copied from a baby's chart. Lactation Consultation Note  Patient Name: Suzanne Bullock Date: 12/20/2020 Reason for consult: Follow-up assessment Age:37 hours   Mother reports that she is not getting anything when she pumps. Offered to assist with hand expression. Mother reports that she has not seen any colostrum .  Taught hand expression and drops observed from both breast. Mother very excited . She didn't think she was going to have any milk. Encouraged to pump every 3 hours for 15-20 mins. Advised to call for assistance and offer infant breast. Mother reports that she plans to purchase a electric pump when going home. Discussed getting a good quality pump if she plans to pump long term.  Maternal Data    Feeding Mother's Current Feeding Choice: Breast Milk and Formula  LATCH Score                    Lactation Tools Discussed/Used    Interventions    Discharge    Consult Status Consult Status: Follow-up Date: 12/20/20 Follow-up type: In-patient    Jess Barters [facility] 12/20/2020, 3:38 PM

## 2020-12-21 ENCOUNTER — Ambulatory Visit: Payer: Self-pay

## 2020-12-21 NOTE — Lactation Note (Signed)
This note was copied from a baby's chart. Lactation Consultation Note  Patient Name: Boy Jalayla Chrismer [id number] Date: 12/21/2020 Reason for consult: Follow-up assessment Age:37 hours   LC Follow Up Visit:  Third attempt to visit with mother; asked her to call after her shower this a.m.  Mother adjusting car seat and almost ready for discharge.  She had no questions/concerns at this time.  She has our OP phone number for any concerns after discharge.  Family member present.   Maternal Data    Feeding    LATCH Score                    Lactation Tools Discussed/Used    Interventions    Discharge    Consult Status Consult Status: Complete Date: 12/21/20 Follow-up type: Call as needed    Hudsyn Barich R Avier Jech 12/21/2020, 1:51 PM

## 2020-12-21 NOTE — Lactation Note (Signed)
This note was copied from a baby's chart. Lactation Consultation Note  Patient Name: Suzanne Bullock [id number] Date: 12/21/2020 Reason for consult: Follow-up assessment Age:37 hours   LC Follow Up Visit:  Attempted to visit with mother, however, she was asleep.  Will return later today.   Maternal Data    Feeding    LATCH Score                    Lactation Tools Discussed/Used    Interventions    Discharge    Consult Status Consult Status: Follow-up Date: 12/21/20 Follow-up type: In-patient    Teah Votaw R Joziah Dollins 12/21/2020, 8:54 AM

## 2020-12-31 ENCOUNTER — Encounter (HOSPITAL_COMMUNITY): Payer: No Typology Code available for payment source

## 2021-10-28 IMAGING — US US MFM OB FOLLOW-UP
1 series · 13 of 28 positions shown · non-contrast
Comparison: none

[Series 1: us mfm ob follow-up · 61 acquisitions, 13 frames shown]
[im 3/61]
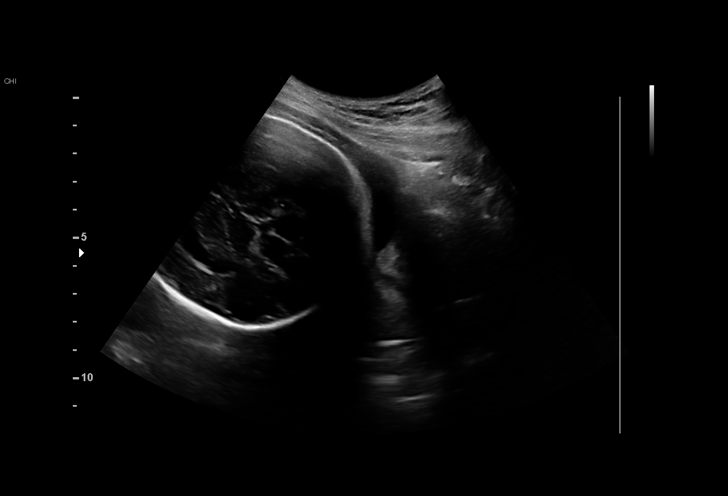
[im 7/61]
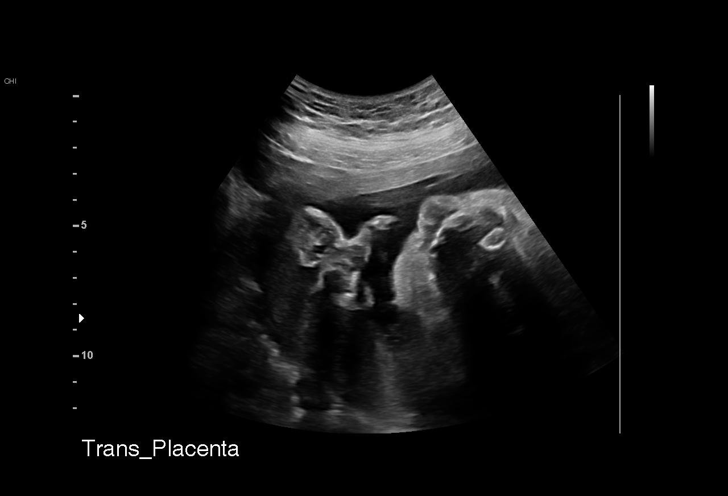
[im 12/61]
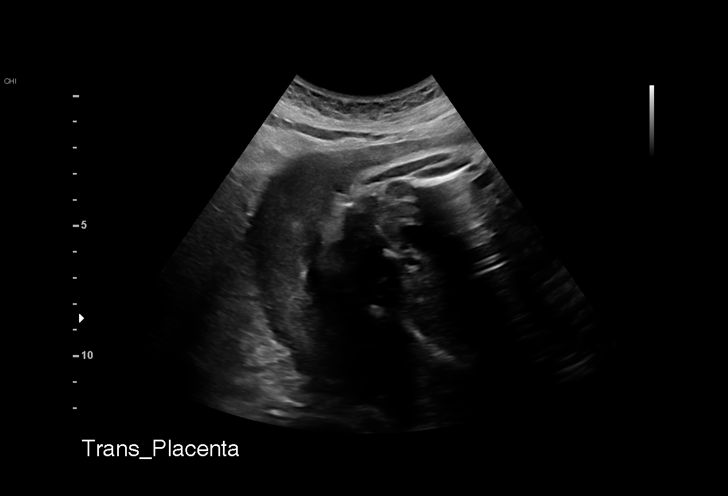
[im 16/61]
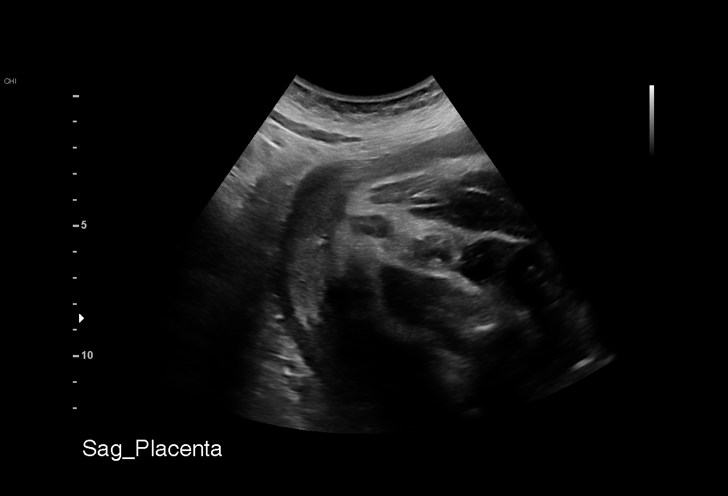
[im 21/61]
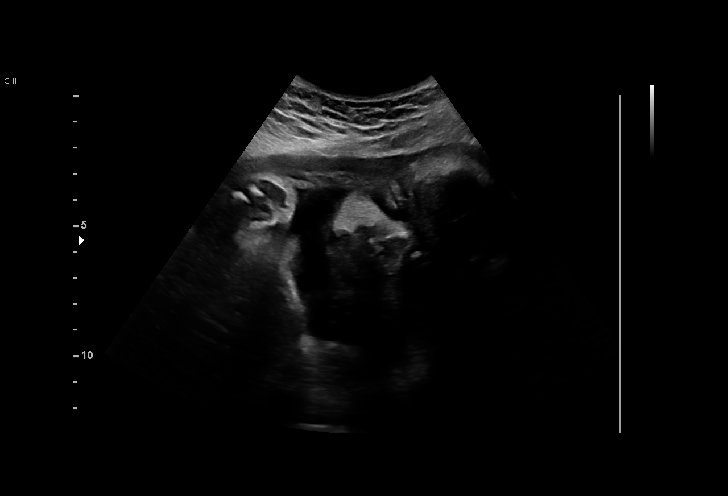
[im 25/61]
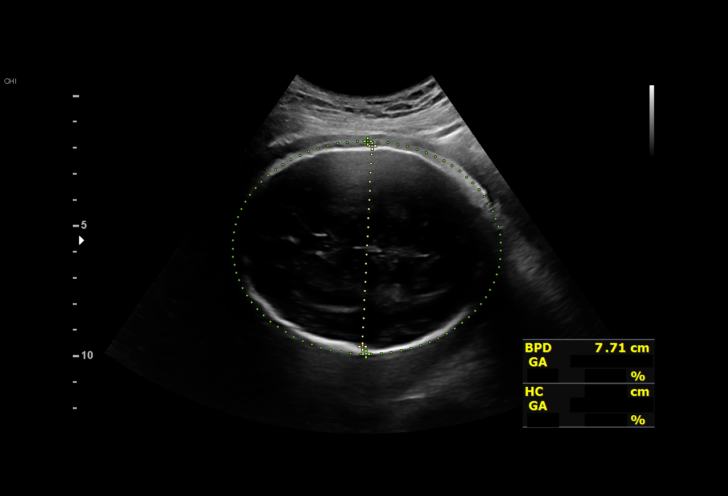
[im 32/61]
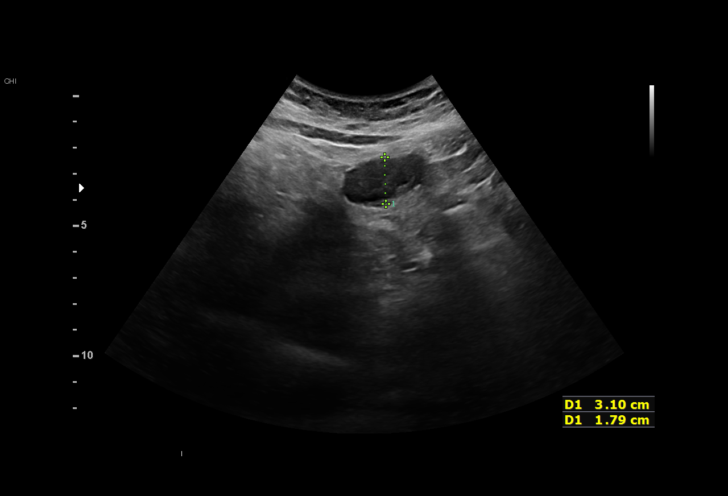
[im 36/61]
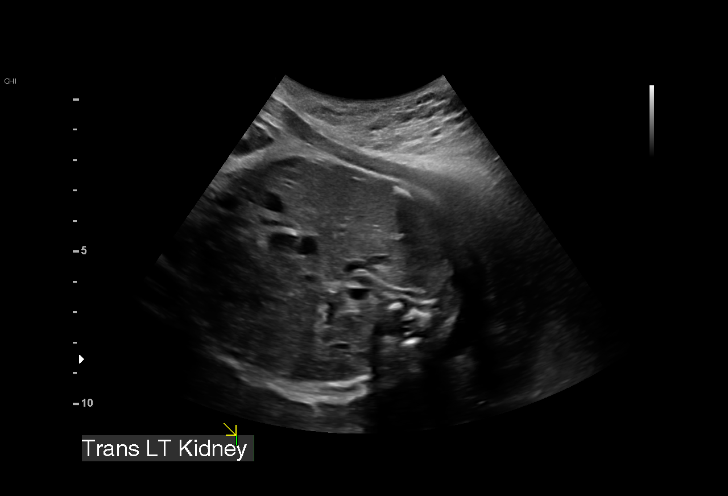
[im 41/61]
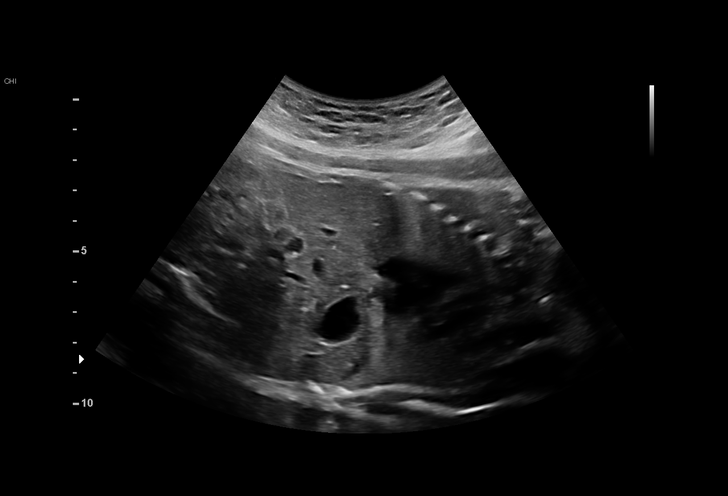
[im 45/61]
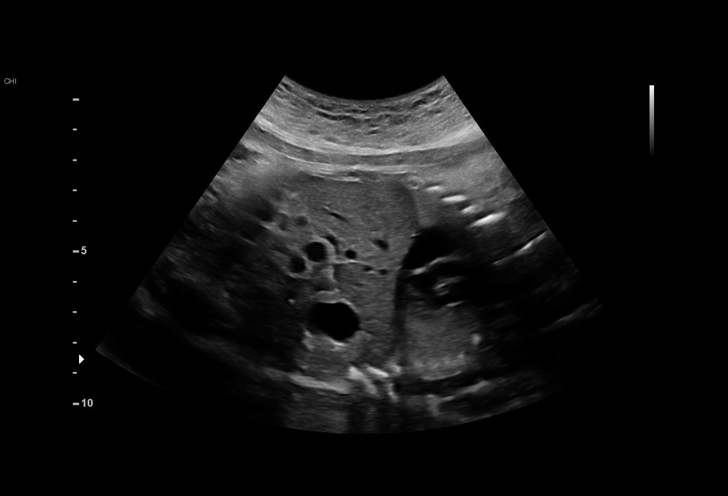
[im 49/61]
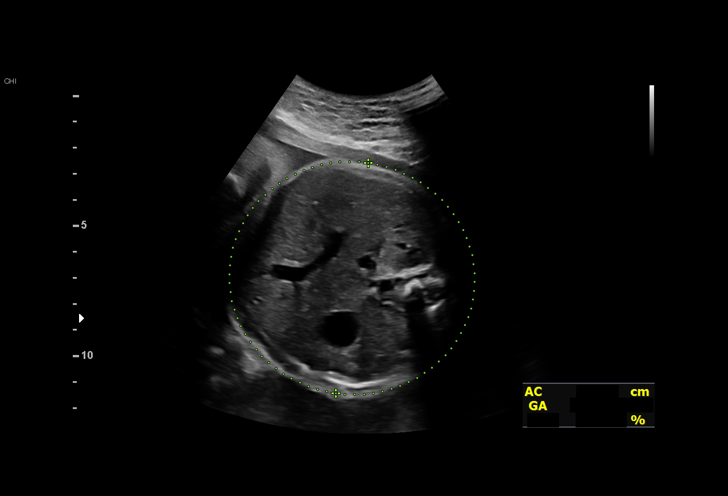
[im 54/61]
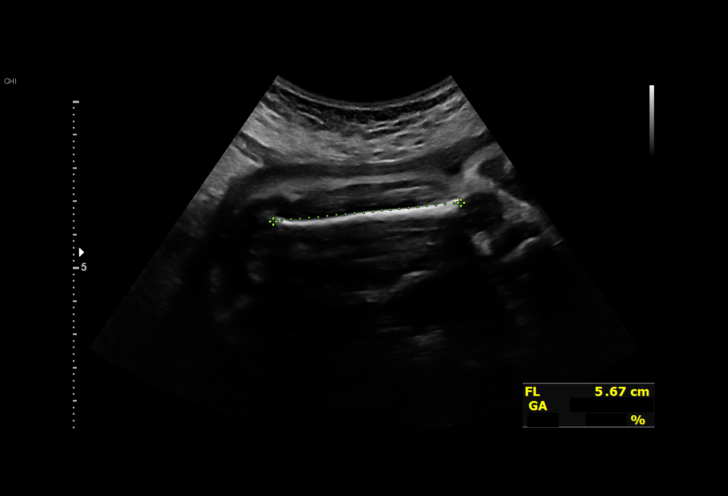
[im 58/61]
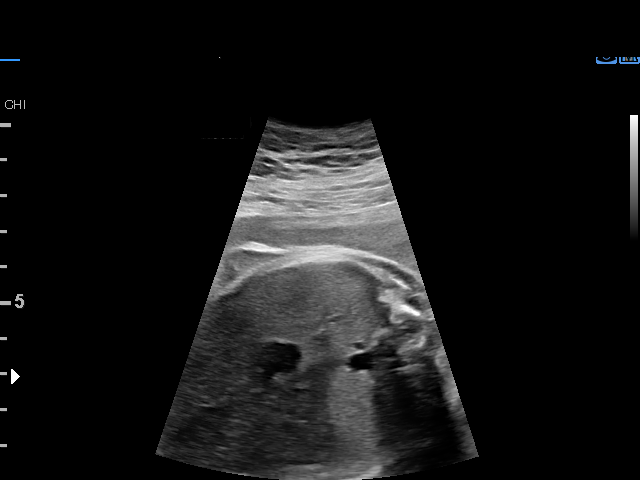

[13 of 28 positions shown; findings below may reference images not displayed]

Obstetrics &
                                                            Gynecology
                                                            [id number]
                                                            [place].
                   CNM

Indications

 30 weeks gestation of pregnancy
 Advanced maternal age multigravida 35+,
 third trimester
 Hypertension - Chronic/Pre-existing
 (Labetalol)
 Encounter for other antenatal screening
 follow-up
 Low risk NIPS
 Genetic carrier (SMA)
 Uterine fibroids affecting pregnancy in third  O34.13,
 trimester, antepartum
 Poor obstetric history-Recurrent (habitual)
 abortion (3 consecutive abs)
 Anemia during pregnancy in third trimester
Fetal Evaluation

 Num Of Fetuses:         1
 Fetal Heart Rate(bpm):  130
 Cardiac Activity:       Observed
 Presentation:           Cephalic
 Placenta:               Posterior
 P. Cord Insertion:      Previously Visualized
 Amniotic Fluid
 AFI FV:      Within normal limits

 AFI Sum(cm)     %Tile       Largest Pocket(cm)
 9.7             11

 RUQ(cm)       RLQ(cm)       LUQ(cm)        LLQ(cm)

Biometry

 BPD:      77.6  mm     G. Age:  31w 1d         69  %    CI:        71.89   %    70 - 86
                                                         FL/HC:      19.6   %    19.2 -
 HC:      291.3  mm     G. Age:  32w 1d         71  %    HC/AC:      1.02        0.99 -
 AC:      286.6  mm     G. Age:  32w 5d         97  %    FL/BPD:     73.6   %    71 - 87
 FL:       57.1  mm     G. Age:  30w 0d         29  %    FL/AC:      19.9   %    20 - 24

 LV:          2  mm

 Est. FW:    5256  gm           4 lb     87  %
OB History

 Gravidity:    5         Term:   0        Prem:   0        SAB:   3
 TOP:          1       Ectopic:  0        Living: 0
Gestational Age

 LMP:           30w 1d        Date:  03/26/20                 EDD:   12/31/20
 U/S Today:     31w 4d                                        EDD:   12/21/20
 Best:          30w 1d     Det. By:  LMP  (03/26/20)          EDD:   12/31/20
Anatomy

 Cranium:               Appears normal         LVOT:                   Previously seen
 Cavum:                 Appears normal         Aortic Arch:            Previously seen
 Ventricles:            Appears normal         Ductal Arch:            Previously seen
 Choroid Plexus:        Previously seen        Diaphragm:              Appears normal
 Cerebellum:            Previously seen        Stomach:                Appears normal, left
                                                                       sided
 Posterior Fossa:       Previously seen        Abdomen:                Appears normal
 Nuchal Fold:           Not applicable (>20    Abdominal Wall:         Previously seen
                        wks GA)
 Face:                  Orbits and profile     Cord Vessels:           Previously seen
                        previously seen
 Lips:                  Previously seen        Kidneys:                Appear normal
 Palate:                Previously seen        Bladder:                Appears normal
 Thoracic:              Appears normal         Spine:                  Previously seen
 Heart:                 Previously seen        Upper Extremities:      Previously seen
 RVOT:                  Previously seen        Lower Extremities:      Previously seen

 Other:  Fetus appears to be a male. Nasal bone Prev visualized. Lenses Prev
         visualized.Heels/feet and hands prev visualized. Technically difficult
         due to fetal position.
Cervix Uterus Adnexa
 Cervix
 Not visualized (advanced GA >68wks)

 Uterus
 Multiple fibroids noted.

 Right Ovary
 Not visualized.

 Left Ovary
 Not visualized.

 Cul De Sac
 No free fluid seen.

 Adnexa
 No abnormality visualized.
Impression

 Chronic hypertension.
 BP at our office: 147/83 mm Hg .
 Patient reports she had blood drawn today for GDM
 screening.

 Fetal growth is appropriate for gestational age .Amniotic fluid
 is normal and good fetal activity is seen .
Recommendations

 -Appointments were made for weekly BPP from 32 weeks'
 gestation till delivery.
                 [place], Dhaifan

## 2021-11-26 IMAGING — US US MFM OB FOLLOW-UP
1 series · 14 of 27 positions shown · non-contrast
Comparison: none

[Series 1: us mfm ob follow-up · 27 acquisitions, 14 frames shown]
[im 1/27]
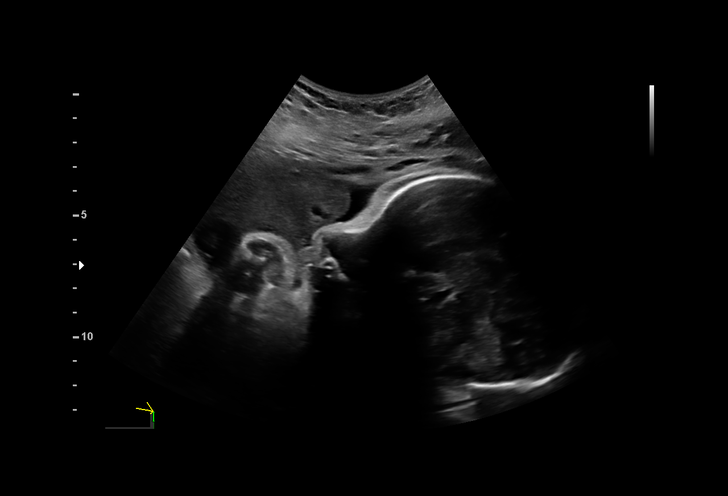
[im 3/27]
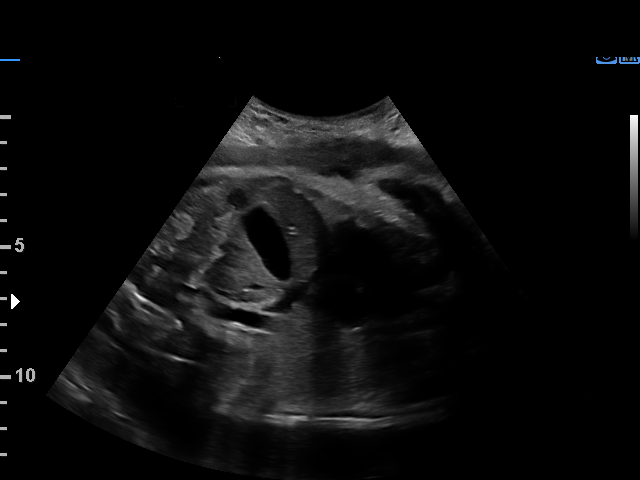
[im 5/27]
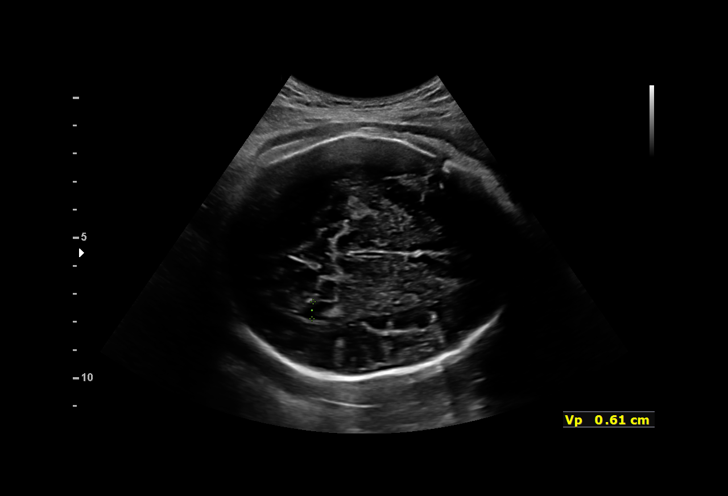
[im 7/27]
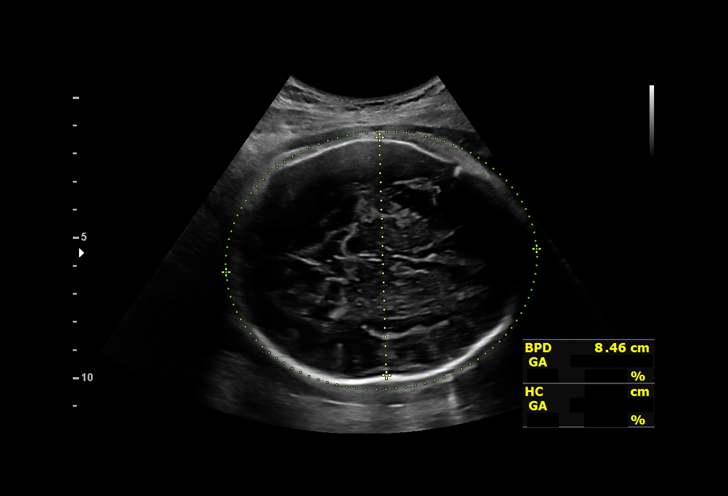
[im 9/27]
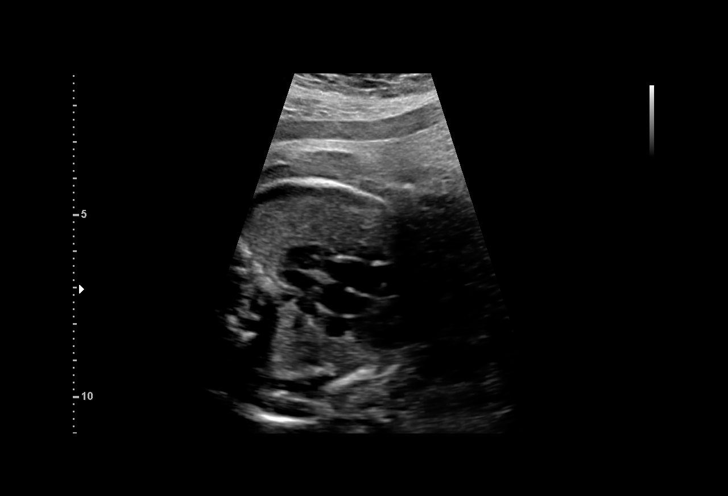
[im 11/27]
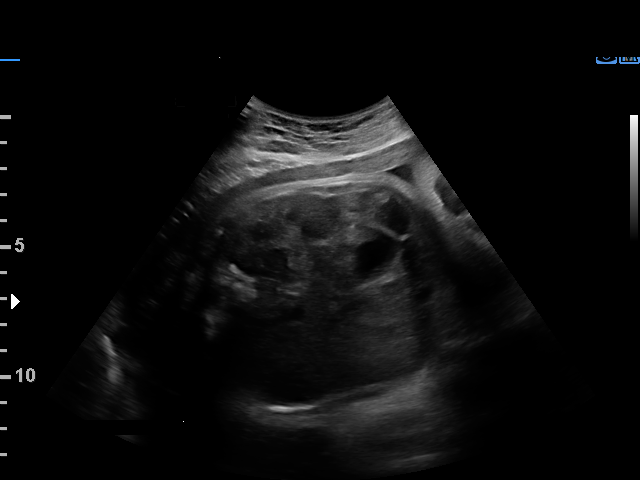
[im 13/27]
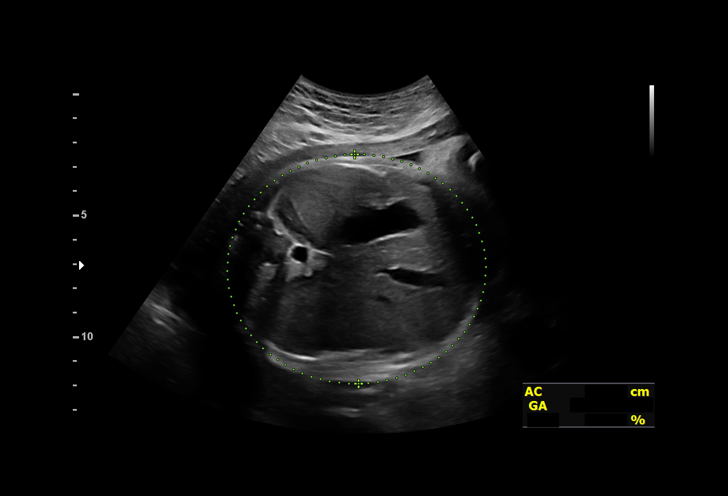
[im 15/27]
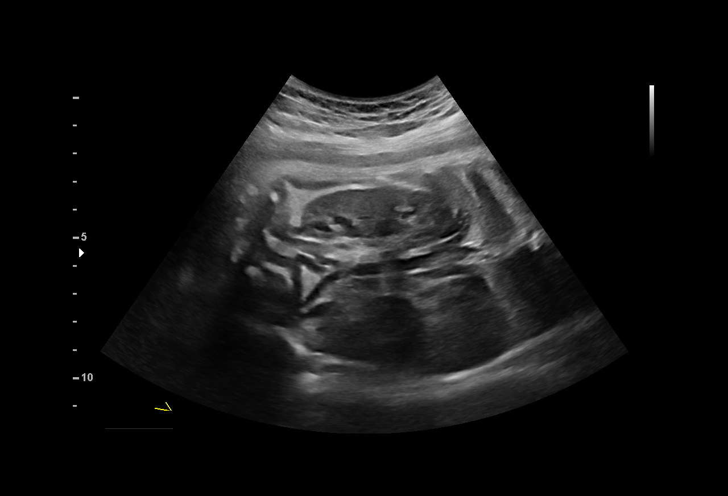
[im 17/27]
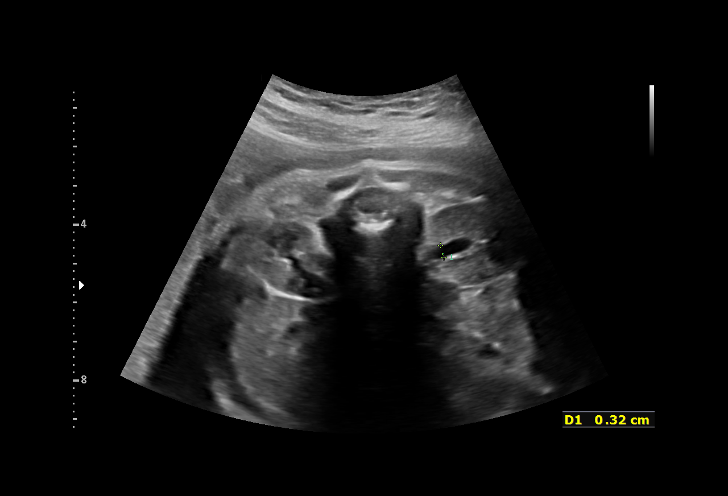
[im 19/27]
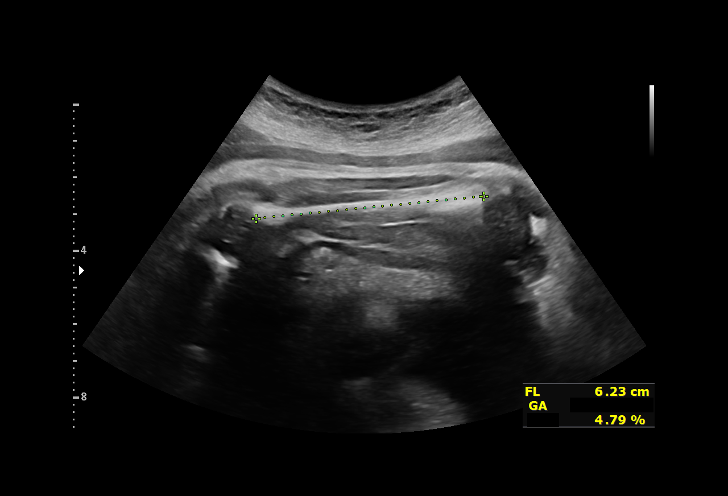
[im 21/27]
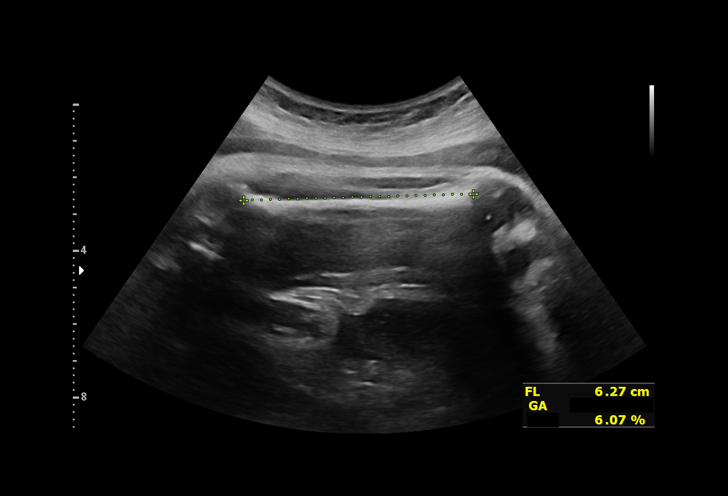
[im 23/27]
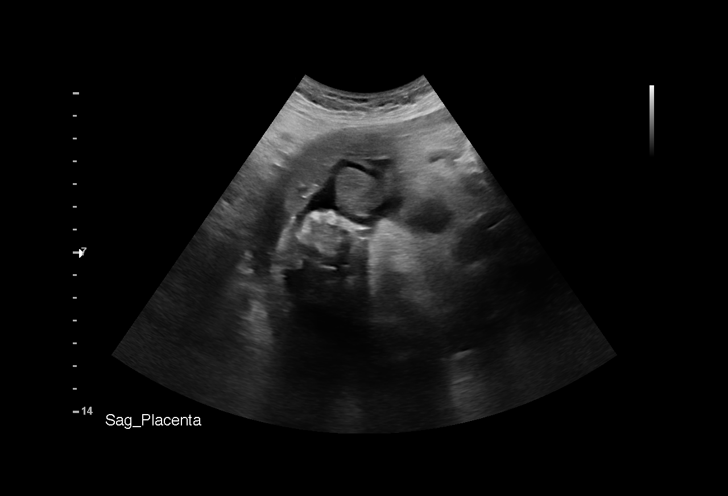
[im 25/27]
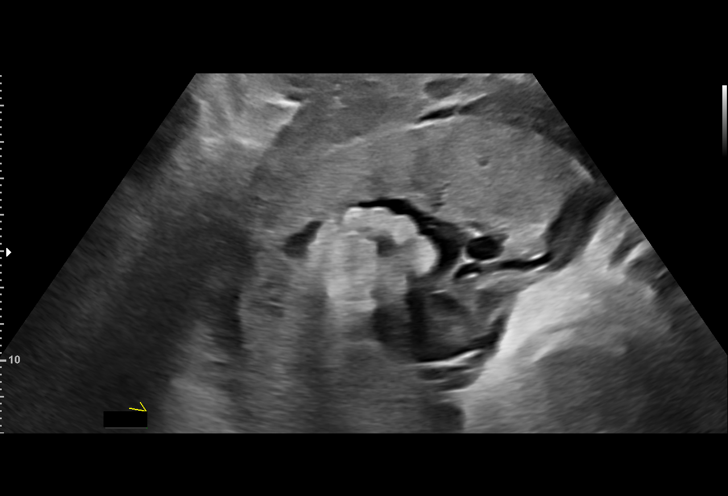
[im 27/27]
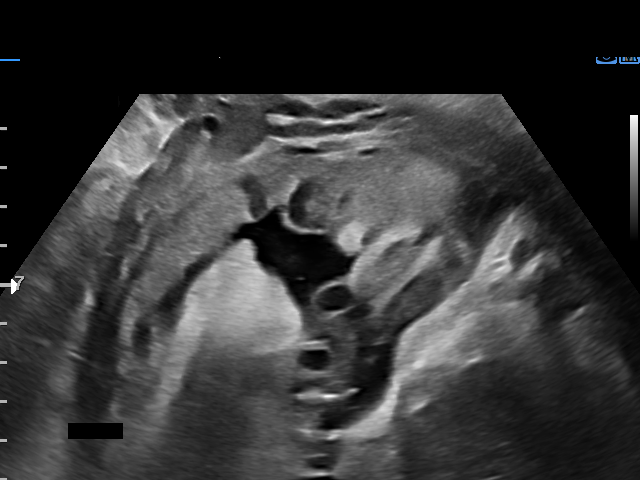

[14 of 27 positions shown; findings below may reference images not displayed]

Obstetrics &
                                                            Gynecology
                                                            [id number]
                                                            [place].
                   CNM

Indications

 34 weeks gestation of pregnancy
 Advanced maternal age multigravida 35+,
 third trimester
 Hypertension - Chronic/Pre-existing
 (Labetalol)
 Encounter for other antenatal screening
 follow-up
 Uterine fibroids affecting pregnancy in third  O34.13,
 trimester, antepartum
 Poor obstetric history-Recurrent (habitual)
 abortion (3 consecutive abs)
 Anemia during pregnancy in third trimester
Fetal Evaluation

 Num Of Fetuses:         1
 Fetal Heart Rate(bpm):  143
 Cardiac Activity:       Observed
 Presentation:           Cephalic
 Placenta:               Posterior
 P. Cord Insertion:      Previously Visualized
 Amniotic Fluid
 AFI FV:      Subjectively low-normal

 AFI Sum(cm)     %Tile       Largest Pocket(cm)
 11.8            33

 RUQ(cm)       RLQ(cm)       LUQ(cm)        LLQ(cm)

Biophysical Evaluation

 Amniotic F.V:   Within normal limits       F. Tone:        Observed
 F. Movement:    Observed                   Score:          [DATE]
 F. Breathing:   Observed
Biometry

 BPD:      84.8  mm     G. Age:  34w 1d         45  %    CI:         72.1   %    70 - 86
                                                         FL/HC:      19.8   %    19.4 -
 HC:      317.8  mm     G. Age:  35w 5d         52  %    HC/AC:      1.00        0.96 -
 AC:      318.1  mm     G. Age:  35w 5d         89  %    FL/BPD:     74.2   %    71 - 87
 FL:       62.9  mm     G. Age:  32w 4d          7  %    FL/AC:      19.8   %    20 - 24

 LV:        6.1  mm

 Est. FW:    6498  gm      5 lb 8 oz     58  %
OB History

 Gravidity:    5         Term:   0        Prem:   0        SAB:   3
 TOP:          1       Ectopic:  0        Living: 0
Gestational Age

 LMP:           34w 2d        Date:  03/26/20                 EDD:   12/31/20
 U/S Today:     34w 4d                                        EDD:   12/29/20
 Best:          34w 2d     Det. By:  LMP  (03/26/20)          EDD:   12/31/20
Anatomy

 Cranium:               Appears normal         LVOT:                   Previously seen
 Cavum:                 Previously seen        Aortic Arch:            Previously seen
 Ventricles:            Appears normal         Ductal Arch:            Previously seen
 Choroid Plexus:        Previously seen        Diaphragm:              Previously seen
 Cerebellum:            Previously seen        Stomach:                Appears normal, left
                                                                       sided
 Posterior Fossa:       Previously seen        Abdomen:                Previously seen
 Nuchal Fold:           Not applicable (>20    Abdominal Wall:         Previously seen
                        wks GA)
 Face:                  Appears normal         Cord Vessels:           Previously seen
                        (orbits and profile)
 Lips:                  Previously seen        Kidneys:                Appear normal
 Palate:                Previously seen        Bladder:                Appears normal
 Thoracic:              Previously seen        Spine:                  Previously seen
 Heart:                 Appears normal         Upper Extremities:      Previously seen
                        (4CH, axis, and
                        situs)
 RVOT:                  Previously seen        Lower Extremities:      Previously seen

 Other:  Male gender previously seen.
Cervix Uterus Adnexa

 Cervix
 Not visualized (advanced GA >82wks)
Impression

 Chronic hypertension.  Patient takes labetalol for control.
 Blood pressure today at her office is 122/83 mmHg.
 Amniotic fluid is normal good fetal activity seen.  Antenatal
 testing is reassuring.  BPP [DATE].
 I reassured the patient of the findings.
Recommendations

 -Continue weekly BPP till delivery.
                 Freund, Latia

## 2021-12-02 IMAGING — US US MFM FETAL BPP W/O NON-STRESS
1 series · 14 of 14 positions shown · non-contrast
Comparison: none

[Series 1: us mfm fetal bpp w/o non-stress · 14 acquisitions, 14 frames shown]
[im 1/14]
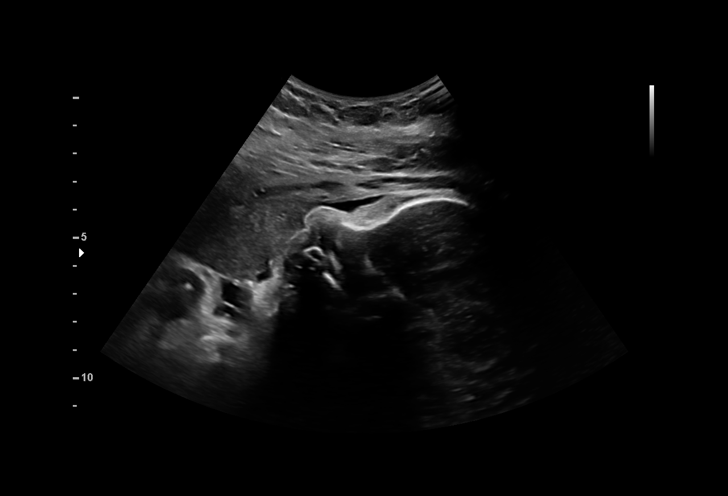
[im 2/14]
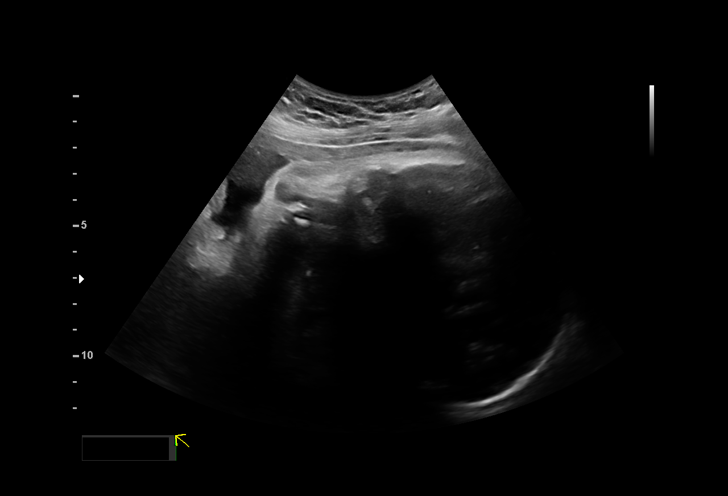
[im 3/14]
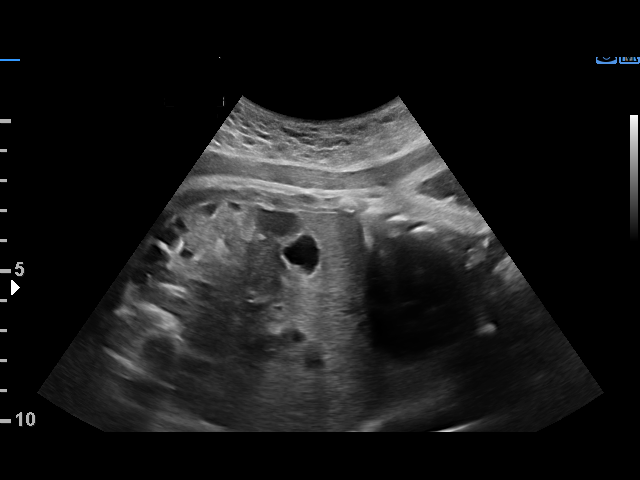
[im 4/14]
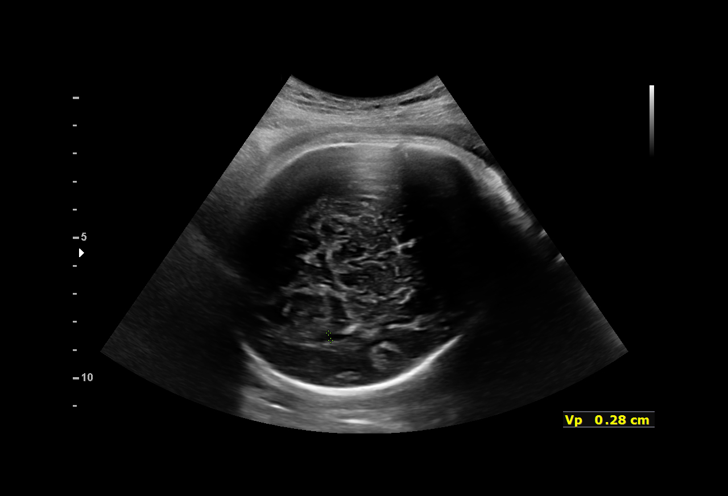
[im 5/14]
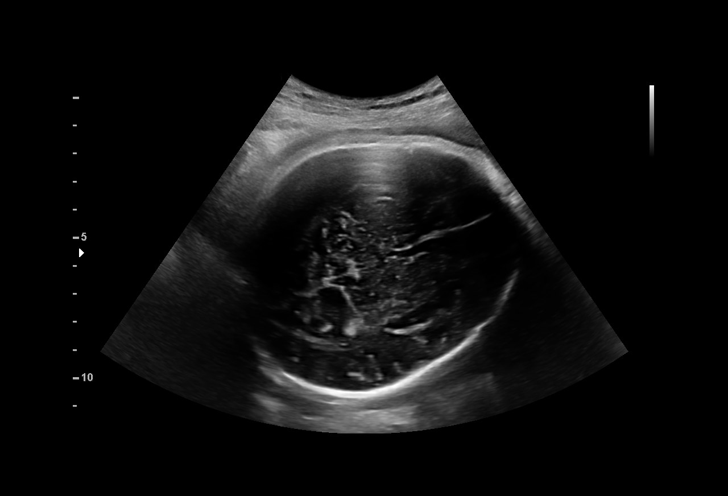
[im 6/14]
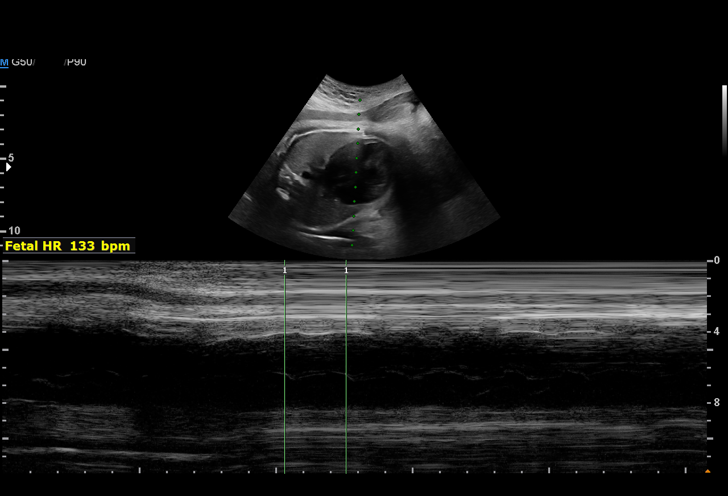
[im 7/14]
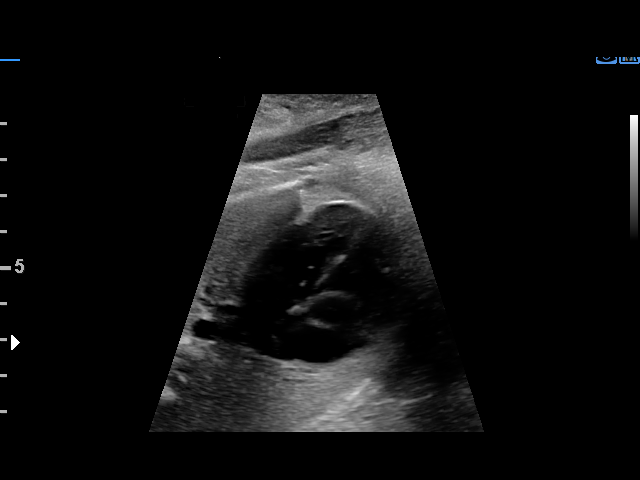
[im 8/14]
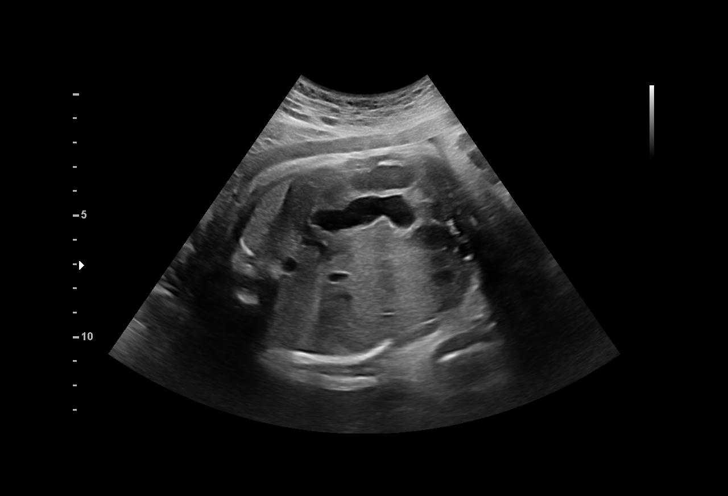
[im 9/14]
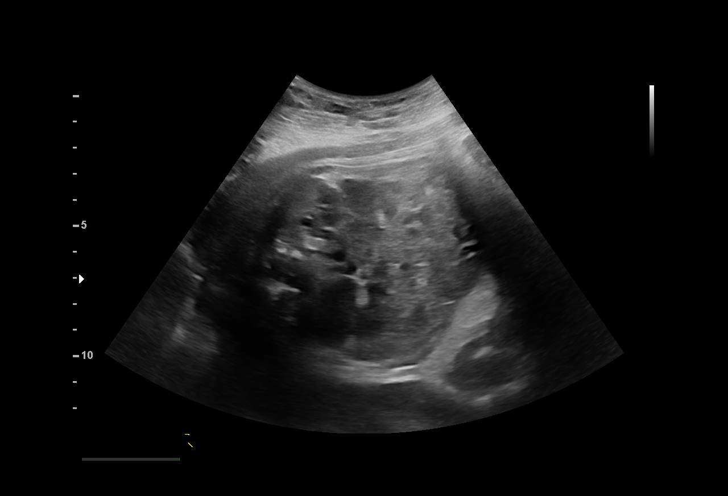
[im 10/14]
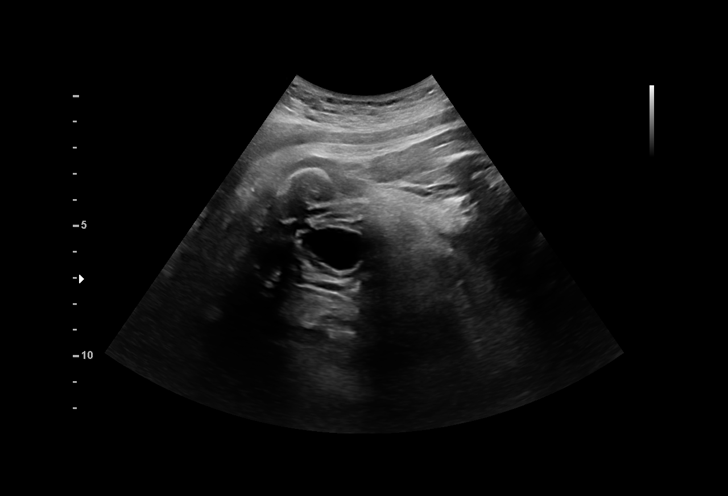
[im 11/14]
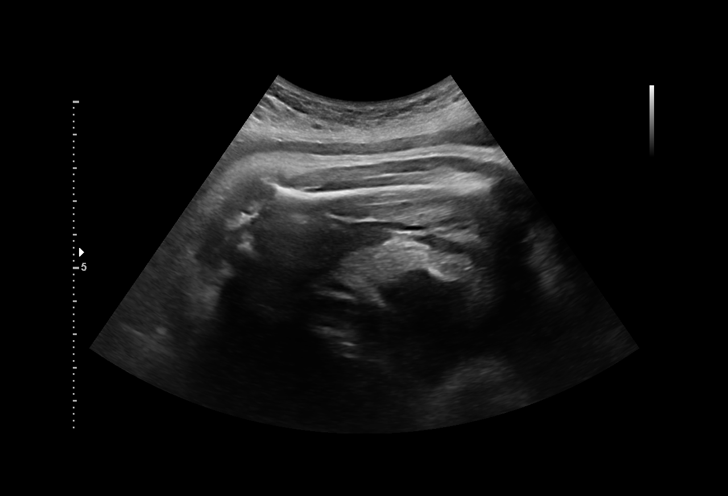
[im 12/14]
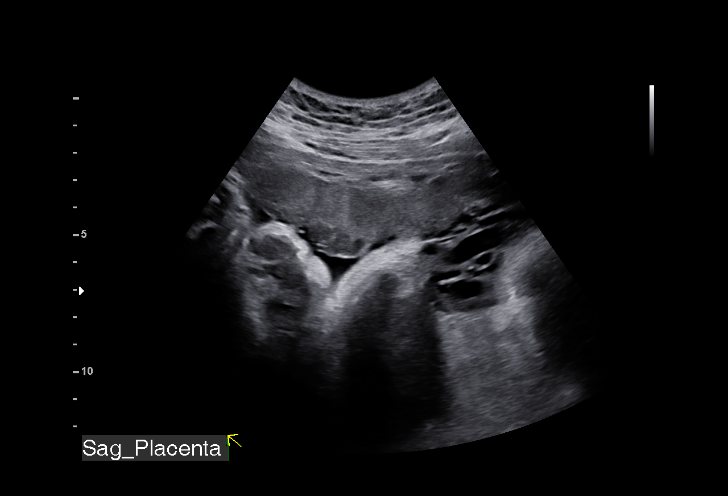
[im 13/14]
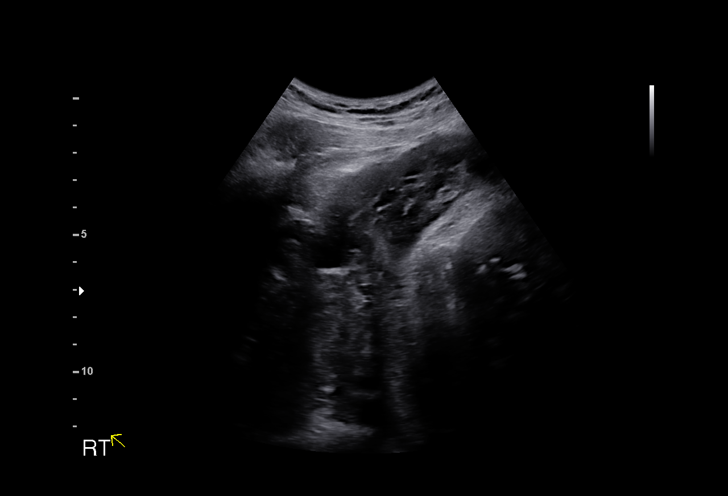
[im 14/14]
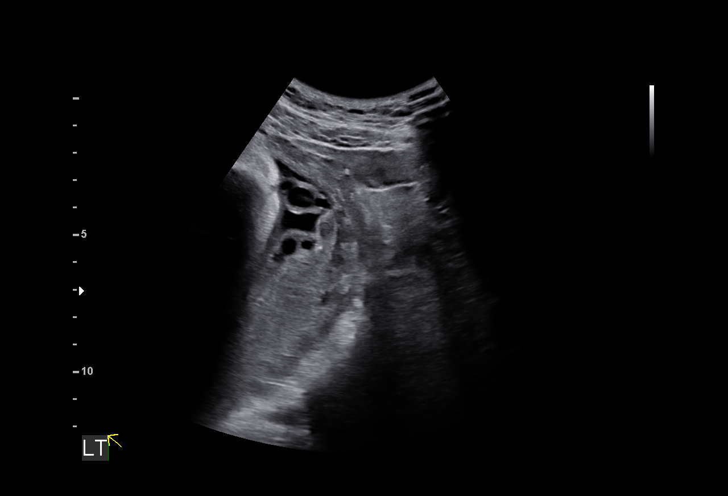

[14 of 14 positions shown; findings below may reference images not displayed]

Obstetrics &
                                                            Gynecology
                                                            [id number]
                                                            [place].
                   CNM

Indications

 35 weeks gestation of pregnancy
 Advanced maternal age multigravida 35+,
 third trimester
 Hypertension - Chronic/Pre-existing
 (Labetalol)
 Encounter for other antenatal screening
 follow-up
 Uterine fibroids affecting pregnancy in third  O34.13,
 trimester, antepartum
 Poor obstetric history-Recurrent (habitual)
 abortion (3 consecutive abs)
 Anemia during pregnancy in third trimester
Fetal Evaluation

 Num Of Fetuses:         1
 Fetal Heart Rate(bpm):  133
 Cardiac Activity:       Observed
 Presentation:           Cephalic
 Placenta:               Posterior
 P. Cord Insertion:      Previously Visualized
 Amniotic Fluid
 AFI FV:      Within normal limits

 AFI Sum(cm)     %Tile       Largest Pocket(cm)
 11.34           31

 RUQ(cm)       RLQ(cm)       LUQ(cm)        LLQ(cm)

Biophysical Evaluation

 Amniotic F.V:   Within normal limits       F. Tone:        Observed
 F. Movement:    Observed                   Score:          [DATE]
 F. Breathing:   Observed
Biometry

 LV:        2.8  mm
OB History

 Gravidity:    5         Term:   0        Prem:   0        SAB:   3
 TOP:          1       Ectopic:  0        Living: 0
Gestational Age

 LMP:           35w 1d        Date:  03/26/20                 EDD:   12/31/20
 Best:          35w 1d     Det. By:  LMP  (03/26/20)          EDD:   12/31/20
Anatomy

 Ventricles:            Appears normal         Kidneys:                Appear normal
 Heart:                 Appears normal         Bladder:                Appears normal
                        (4CH, axis, and
                        situs)
 Stomach:               Appears normal, left
                        sided
Cervix Uterus Adnexa

 Cervix
 Not visualized (advanced GA >19wks)

 Right Ovary
 Not visualized.

 Left Ovary
 Not visualized.
Impression

 Chronic hypertension.  Patient takes labetalol and Procardia
 for control.  Blood pressure today at her office is 145/87
 mmHg.
 Amniotic fluid is normal and good fetal activity is seen
 .Antenatal testing is reassuring. BPP [DATE].
Recommendations

 -Continue weekly BPP till delivery.
 -Consider delivery at 38 to 39 weeks.
                 Badhon, Lvin
